# Patient Record
Sex: Male | Born: 1955
Health system: Southern US, Community
[De-identification: ages and names within clinical notes are randomized; demographics above are authoritative.]

## PROBLEM LIST (undated history)

## (undated) DIAGNOSIS — I82409 Acute embolism and thrombosis of unspecified deep veins of unspecified lower extremity: Secondary | ICD-10-CM

## (undated) DIAGNOSIS — E7800 Pure hypercholesterolemia, unspecified: Secondary | ICD-10-CM

## (undated) DIAGNOSIS — M1990 Unspecified osteoarthritis, unspecified site: Secondary | ICD-10-CM

## (undated) DIAGNOSIS — E119 Type 2 diabetes mellitus without complications: Secondary | ICD-10-CM

## (undated) DIAGNOSIS — F419 Anxiety disorder, unspecified: Secondary | ICD-10-CM

## (undated) DIAGNOSIS — IMO0001 Reserved for inherently not codable concepts without codable children: Secondary | ICD-10-CM

## (undated) DIAGNOSIS — C801 Malignant (primary) neoplasm, unspecified: Secondary | ICD-10-CM

## (undated) DIAGNOSIS — I10 Essential (primary) hypertension: Secondary | ICD-10-CM

## (undated) DIAGNOSIS — G4730 Sleep apnea, unspecified: Secondary | ICD-10-CM

## (undated) DIAGNOSIS — K219 Gastro-esophageal reflux disease without esophagitis: Secondary | ICD-10-CM

## (undated) HISTORY — DX: Anxiety disorder, unspecified: F41.9

## (undated) HISTORY — DX: Unspecified osteoarthritis, unspecified site: M19.90

## (undated) HISTORY — PX: HEEL SPUR SURGERY: SHX665

## (undated) HISTORY — PX: BUNIONECTOMY: SHX129

## (undated) HISTORY — DX: Sleep apnea, unspecified: G47.30

## (undated) HISTORY — PX: SHOULDER SURGERY: SHX246

## (undated) HISTORY — PX: TONSILLECTOMY: SUR1361

## (undated) HISTORY — PX: LIPOMA EXCISION: SHX5283

## (undated) HISTORY — PX: ANKLE SURGERY: SHX546

---

## 2000-12-08 ENCOUNTER — Ambulatory Visit (HOSPITAL_COMMUNITY): Admission: RE | Admit: 2000-12-08 | Discharge: 2000-12-08 | Payer: Self-pay | Admitting: Pulmonary Disease

## 2000-12-17 ENCOUNTER — Ambulatory Visit (HOSPITAL_COMMUNITY): Admission: RE | Admit: 2000-12-17 | Discharge: 2000-12-17 | Payer: Self-pay | Admitting: Internal Medicine

## 2000-12-17 ENCOUNTER — Encounter: Payer: Self-pay | Admitting: Internal Medicine

## 2001-03-10 ENCOUNTER — Ambulatory Visit (HOSPITAL_COMMUNITY): Admission: RE | Admit: 2001-03-10 | Discharge: 2001-03-10 | Payer: Self-pay | Admitting: Internal Medicine

## 2001-03-10 ENCOUNTER — Encounter: Payer: Self-pay | Admitting: Internal Medicine

## 2001-04-18 ENCOUNTER — Ambulatory Visit (HOSPITAL_COMMUNITY): Admission: RE | Admit: 2001-04-18 | Discharge: 2001-04-18 | Payer: Self-pay | Admitting: Internal Medicine

## 2002-03-08 ENCOUNTER — Encounter: Admission: RE | Admit: 2002-03-08 | Discharge: 2002-06-06 | Payer: Self-pay | Admitting: Internal Medicine

## 2003-02-19 ENCOUNTER — Encounter: Payer: Self-pay | Admitting: Emergency Medicine

## 2003-02-19 ENCOUNTER — Observation Stay (HOSPITAL_COMMUNITY): Admission: EM | Admit: 2003-02-19 | Discharge: 2003-02-20 | Payer: Self-pay | Admitting: Emergency Medicine

## 2003-02-23 ENCOUNTER — Encounter: Payer: Self-pay | Admitting: *Deleted

## 2003-02-23 ENCOUNTER — Encounter (HOSPITAL_COMMUNITY): Admission: RE | Admit: 2003-02-23 | Discharge: 2003-03-25 | Payer: Self-pay | Admitting: *Deleted

## 2005-03-12 ENCOUNTER — Ambulatory Visit (HOSPITAL_COMMUNITY): Admission: RE | Admit: 2005-03-12 | Discharge: 2005-03-12 | Payer: Self-pay | Admitting: Internal Medicine

## 2005-04-02 ENCOUNTER — Ambulatory Visit: Payer: Self-pay | Admitting: Internal Medicine

## 2005-04-29 ENCOUNTER — Ambulatory Visit: Payer: Self-pay | Admitting: Internal Medicine

## 2005-04-29 ENCOUNTER — Ambulatory Visit (HOSPITAL_COMMUNITY): Admission: RE | Admit: 2005-04-29 | Discharge: 2005-04-29 | Payer: Self-pay | Admitting: Internal Medicine

## 2005-05-01 ENCOUNTER — Inpatient Hospital Stay (HOSPITAL_COMMUNITY): Admission: AD | Admit: 2005-05-01 | Discharge: 2005-05-04 | Payer: Self-pay | Admitting: Internal Medicine

## 2005-05-01 ENCOUNTER — Ambulatory Visit (HOSPITAL_COMMUNITY): Admission: RE | Admit: 2005-05-01 | Discharge: 2005-05-01 | Payer: Self-pay | Admitting: Internal Medicine

## 2005-05-05 ENCOUNTER — Ambulatory Visit (HOSPITAL_COMMUNITY): Payer: Self-pay | Admitting: Internal Medicine

## 2005-05-05 ENCOUNTER — Encounter (HOSPITAL_COMMUNITY): Admission: RE | Admit: 2005-05-05 | Discharge: 2005-06-04 | Payer: Self-pay | Admitting: Oncology

## 2005-05-05 ENCOUNTER — Encounter: Admission: RE | Admit: 2005-05-05 | Discharge: 2005-05-05 | Payer: Self-pay | Admitting: Oncology

## 2005-08-21 ENCOUNTER — Ambulatory Visit (HOSPITAL_COMMUNITY): Admission: RE | Admit: 2005-08-21 | Discharge: 2005-08-21 | Payer: Self-pay | Admitting: Internal Medicine

## 2007-03-04 ENCOUNTER — Ambulatory Visit (HOSPITAL_COMMUNITY): Admission: RE | Admit: 2007-03-04 | Discharge: 2007-03-04 | Payer: Self-pay | Admitting: Internal Medicine

## 2007-09-07 ENCOUNTER — Ambulatory Visit (HOSPITAL_COMMUNITY): Admission: RE | Admit: 2007-09-07 | Discharge: 2007-09-07 | Payer: Self-pay | Admitting: Internal Medicine

## 2008-01-16 ENCOUNTER — Ambulatory Visit (HOSPITAL_COMMUNITY): Admission: RE | Admit: 2008-01-16 | Discharge: 2008-01-16 | Payer: Self-pay | Admitting: Internal Medicine

## 2008-02-22 ENCOUNTER — Ambulatory Visit (HOSPITAL_COMMUNITY): Admission: RE | Admit: 2008-02-22 | Discharge: 2008-02-22 | Payer: Self-pay | Admitting: Internal Medicine

## 2008-04-27 ENCOUNTER — Ambulatory Visit (HOSPITAL_COMMUNITY): Admission: RE | Admit: 2008-04-27 | Discharge: 2008-04-27 | Payer: Self-pay | Admitting: Internal Medicine

## 2008-08-24 ENCOUNTER — Ambulatory Visit (HOSPITAL_COMMUNITY): Admission: RE | Admit: 2008-08-24 | Discharge: 2008-08-24 | Payer: Self-pay | Admitting: Internal Medicine

## 2010-11-28 NOTE — H&P (Signed)
NAMEVIOLA, [name]                   ACCOUNT NO.:  [id number]   MEDICAL RECORD NO.:  [id number]          PATIENT TYPE:  INP   LOCATION:  A331                          FACILITY:  [facility]   PHYSICIAN:  Kingsley Callander. Ouida Sills, MD       DATE OF BIRTH:  20-May-1956   DATE OF ADMISSION:  05/01/2005  DATE OF DISCHARGE:  LH                                HISTORY & PHYSICAL   CHIEF COMPLAINT:  Right leg pain.   HISTORY OF PRESENT ILLNESS:  This patient is a 55 year old white male who  presented to my office complaining of excruciating intermittent pain in the  right lower lateral leg for two days. He states this began after he had a  colonoscopy two days ago. He was found to have some left lateral leg  tenderness. Right calf appeared clearly larger than the left. The right  thigh was not obviously larger than the left. He was sent for an ultrasound  which revealed an acute DVT involving the right popliteal and posterior  tibial veins. The patient has no prior history of clots. I had discussed his  leg pain with Dr. Jena Gauss yesterday. It turns out that Mr. Darco had complained  of crampy pain in the leg upon lying down on the procedure table prior to  the colonoscopy. He felt he needed to let the cramp resolve. After waiting a  few minutes, his symptoms resolved, and he was able to proceed with his  colonoscopy without any problems. Mr. Rosenow states that since then he has had  four episodes of severe pain, each lasting about 30 minutes. He states he  was forced to lie down on his driveway for about 30 minutes during one  episode yesterday afternoon. He has had no recent periods of immobility. He  has no history of underlying malignancy.   PAST MEDICAL HISTORY:  1.  Diabetes.  2.  Hypertension.  3.  Hyperlipidemia.  4.  GERD.  5.  Recent hematuria workup which was negative. His PSA was normal. A CT      scan of the abdomen and pelvis revealed a large right lobe of liver      hemangioma, fatty liver changes, but  no stones. Sigmoid diverticulosis      was present.  6.  Hiatal hernia.  7.  Tonsillectomy.  8.  Bunionectomies.   MEDICATIONS:  1.  Prilosec 20 mg b.i.d.  2.  Zantac 300 mg b.i.d.  3.  TriCor 145 mg daily.  4.  Lisinopril 40 mg daily.  5.  Hydrochlorothiazide 25 mg daily.   ALLERGIES:  VICODIN.   SOCIAL HISTORY:  He does not smoke or drink.   FAMILY HISTORY:  Negative for thromboembolic phenomena.   REVIEW OF SYSTEMS:  No chest pain, shortness of breath, abdominal pain, or  recent bleeding.   PHYSICAL EXAMINATION:  GENERAL:  Alert, obviously uncomfortable appearing,  overweight white male.  HEENT:  Eyes and oropharynx are unremarkable.  NECK:  No JVD, thyromegaly, or lymphadenopathy.  LUNGS:  Clear.  HEART:  Regular with no murmurs.  ABDOMEN:  Nontender. No hepatosplenomegaly.  EXTREMITIES:  The right lateral calf is tender. The right calf is swollen.  The right thigh is nontender. Has a normal right femoral pulse. Pedal pulses  are intact.  NEUROLOGICAL:  Normal.  LYMPH NODES:  No enlargement.  SKIN:  Normal.   IMPRESSION:  1.  Right leg deep vein thrombosis. Due to his severity of pain, he will be      hospitalized initially and treated with Lovenox. Coumadin will be      initiated. A hypercoagulation profile will be obtained.  2.  Diabetes.  3.  Hypertension.  4.  Gastroesophageal reflux disease.  5.  Hyperlipidemia.      Kingsley Callander. Ouida Sills, MD  Electronically Signed     ROF/MEDQ  D:  05/01/2005  T:  05/01/2005  Job:  [id number]

## 2010-11-28 NOTE — Procedures (Signed)
   [name], [name]                               ACCOUNT NO.:  [id number]   MEDICAL RECORD NO.:  [id number]                   PATIENT TYPE:  INP   LOCATION:  IC09                                 FACILITY:  [facility]   PHYSICIAN:  Vida Roller, M.D.                DATE OF BIRTH:  September 26, 1955   DATE OF PROCEDURE:  02/20/2003  DATE OF DISCHARGE:                                  ECHOCARDIOGRAM   REFERRING PHYSICIAN:  Kingsley Callander. Ouida Sills, M.D.   TAPE #:  [id number].   TAPE COUNT:  [id number].   REASON FOR CONSULTATION:  This is a 55 year old gentleman with chest pain  and supraventricular tachycardia in the ICU.  The technical quality of the  study is adequate.   M-MODE MEASUREMENTS:  The aorta is 31 mm.   The left atrium is 45 mm.   The septum is 12 mm.   The posterior wall is 10 mm.   Left ventricular diastolic dimension is 48 mm.   The ventricular systolic dimension is 34 mm.   2-D AND DOPPLER IMAGING:  The left ventricle is normal size with normal  systolic function.  There are no wall motion abnormalities seen.  The  ejection fraction is estimated at 55-60%.  There is no left ventricular  hypertrophy.  Diastolic function appears to be normal.   The right ventricle is normal size with normal systolic function.  No wall  motion abnormalities are seen.   Both atria appear to be just the top end of normal.  The left atrium  actually might be slightly enlarged.  Thee is no evidence of atrial septal  defect.   The aortic valve is slightly sclerotic with no evidence stenosis or  regurgitation.   The mitral valve is morphologically unremarkable with no stenosis.  There is  trace insufficiency.   The tricuspid valve is morphologically unremarkable with trace  insufficiency.  No stenosis is seen.   The pulmonic valve appears to be morphologically unremarkable with trace  insufficiency.  No stenosis is seen.   The inferior vena cava is normal size.   The ascending aorta is normal  size.   There is no evidence pericardial pathology.                                               Vida Roller, M.D.    JH/MEDQ  D:  02/20/2003  T:  02/20/2003  Job:  [id number]

## 2010-11-28 NOTE — Consult Note (Signed)
NAMEAENGUS, SAUCEDA                               ACCOUNT NO.:  [id number]   MEDICAL RECORD NO.:  [id number]                   PATIENT TYPE:  INP   LOCATION:  IC09                                 FACILITY:  [facility]   PHYSICIAN:  Vida Roller, M.D.                DATE OF BIRTH:  August 01, 1955   DATE OF CONSULTATION:  DATE OF DISCHARGE:                                   CONSULTATION   HISTORY OF PRESENT ILLNESS:  Mr. [name] is a 55 year old white male with no  known coronary artery disease who presents with an episode of atypical chest  discomfort associated with palpitations.  He states that he was working  early this morning, had an episode of fleeting, sharp pain underneath his  left nipple which radiated up into his chest associated with reaching up  above his head.  He felt mildly unsteady on his feet and the chest  discomfort was only fleeing initially and then slightly worse after that.  He reported to the fire station, down the road, was evaluated initially with  a blood pressure of 180/80 and then a strip which is available for review  which shows supraventricular tachycardia and he was referred to the  emergency department.  When he presented to the emergency department his  chest discomfort was essentially gone and so was his rapid heart rate.  Electrocardiogram there showed only a normal sinus rhythm.  He was admitted  however, for ongoing chest wall tenderness and for evaluation.   PAST MEDICAL HISTORY:  1. Hypertension which is controlled with medications.  2. Diet-controlled diabetes mellitus with a hemoglobin A1c of 5.9.  3. Gastroesophageal reflux disease.  4. History of a Schatzki ring requiring esophageal dilatation.  5. History of a rapid heart rate evaluated a number of years ago, the     results of which are not available to me.   MEDICATIONS ON PRESENTATION:  1. Prinizide 20 mg a day.  2. Prevacid 30 mg a day.  3. Aspirin 325 mg a day.   SOCIAL HISTORY:  He lives  in [place] with his wife.  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  He does not smoke, does not drink  alcohol, does not use any illicit drugs.   FAMILY HISTORY:  His mother is alive and well at age 52.  His father is  alive and well at age 54.  He has one brother at age 9 who is alive and  well.  He has two children and one grandchild all of whom are healthy.   REVIEW OF SYSTEMS:  Essentially noncontributory other than that mentioned in  the history of present illness.   PHYSICAL EXAMINATION:  GENERAL:  He is a well-developed, well-nourished,  white male in no apparent distress who is alert and oriented x4.  VITAL SIGNS:  His blood pressure is 130/85,  respiratory rate of 12 and his  pulse is 78 and sinus.  He is saturating 99% on room air.  He is afebrile.  HEENT:  Unremarkable.  NECK:  Supple.  There is no jugular venous distention or carotid bruits.  CHEST:  Clear to auscultation.  HEART:  Reveals a regular, rate and rhythm with no murmurs, rubs or gallops.  He does have tenderness to palpation over the area described in the history  of present illness.  ABDOMEN:  Soft, nontender with normoactive bowel sounds.  GU:  Deferred.  RECTAL:  Deferred.  EXTREMITIES:  Shows no clubbing, cyanosis or edema.  Pulses are 2+.  MUSCULOSKELETAL:  Without significant joint deformities.  NEUROLOGIC:  Nonfocal.   Chest x-ray reported to me as no acute disease.   His electrocardiogram shows a sinus rhythm at a rate of 82 with normal  intervals, normal axes, no ST-T wave changes concerning for ischemia and  there is borderline voltage criteria for LVH.   LABORATORY DATA:  His white blood cell count is 7.6, H&H of 14 and 40 with a  platelet count of 217,000.  Sodium 138, potassium 3.8, chloride 104, bicarb  of 29, BUN and creatinine are 19 and 0.9 with a blood sugar of 105.  Liver  function studies were within normal limits.  His D-dimmer is slightly  elevated at 0.58.  His  first set of cardiac enzymes showed a CK of 136 with  a troponin of 0.7.  No PT and INR were drawn.  The patient is on Lovenox.   ASSESSMENT:  This is a gentleman with chest wall pain who has an abnormal D-  dimmer, also a history of supraventricular tachycardia on the strip that  looks like an atrial tachycardia with a heart rate nearly 200 beats a  minute.   RECOMMENDATIONS:  1. Cycle his cardiac enzymes.  2. I agree with the continuation of the aspirin and Lovenox.  3. We will get an echocardiogram to assess his left ventricular size and     function.  4. He will probably need an exercise Cardiolite after his echo.  5. He needs an EP study probably as an outpatient for the supraventricular     tachycardia.  6. Most importantly, he probably needs a spiral CT scan to rule out     pulmonary embolus which is I think, at this point, the leading candidate     for the cause of his chest wall pain with an elevated D-dimmer.                                               Vida Roller, M.D.    JH/MEDQ  D:  02/19/2003  T:  02/20/2003  Job:  [id number]

## 2010-11-28 NOTE — H&P (Signed)
[name], [name]                               ACCOUNT NO.:  [id number]   MEDICAL RECORD NO.:  [id number]                   PATIENT TYPE:  INP   LOCATION:  IC09                                 FACILITY:  [facility]   PHYSICIAN:  Kingsley Callander. Ouida Sills, M.D.                  DATE OF BIRTH:  12-Nov-1955   DATE OF ADMISSION:  02/19/2003  DATE OF DISCHARGE:                                HISTORY & PHYSICAL   CHIEF COMPLAINT:  Chest pain.   HISTORY OF PRESENT ILLNESS:  This patient is a 55 year old white male who  presented to the emergency room after developing heaviness in the left  chest.  He had originally reached up over his head and immediately felt pain  in his chest.  He stopped at a fire department and had a monitor strip  recorded which revealed an SVT in the 180 range.  On presentation to the  emergency room, he was treated with sublingual nitroglycerine which caused  relief of his pain.  He did not experience diaphoresis, nausea, vomiting,  syncope, or any radiation of the symptoms into his arm or neck.  He has a  history of type 2 diabetes and hypertension.  He does not smoke cigarettes.   PAST MEDICAL HISTORY:  1. Gastroesophageal reflux disease.  2. Hypertension.  3. Type 2 diabetes.  4. Tonsillectomy.  5. Hiatal hernia.  6. Bunionectomy.   MEDICATIONS:  1. Prinzide 20/25 q.d.  2. Prevacid 30 mg q.d.  3. Multivitamin q.d.   ALLERGIES:  VICODIN.   SOCIAL HISTORY:  He works for [employer].  He does not smoke cigarettes, drink  alcohol, or use recreational drugs.   FAMILY HISTORY:  Positive for diabetes in his mother.   REVIEW OF SYSTEMS:  His GERD symptoms have been well controlled.  The  present symptoms are definitely different from prior GERD symptoms.  He has  previously had dysphagia with dilation of a Schatzki's ring.   PHYSICAL EXAMINATION:  VITAL SIGNS:  Temperature 98.2.  Pulse 72.  Respirations 16.  Blood pressure 133/85.  His oxygen saturation is 99% on  room air.  GENERAL:  Alert, comfortable appearing, white male.  HEENT:  No scleral icterus.  The pharynx is unremarkable.  NECK:  Supple with no JVD, thyromegaly, or bruit.  LUNGS:  Clear.  HEART:  Regular with no murmurs.  ABDOMEN:  Nontender with no hepatosplenomegaly.  CHEST:  He has tenderness to palpation of the left upper chest.  EXTREMITIES:  Normal pulses.  No cyanosis, clubbing, or edema.  NEUROLOGIC:  Grossly intact.   LABORATORY DATA:  White count 7.6.  Hemoglobin 13.8.  Platelets 217.  His  comp profile is normal.  Initial CPK was 136 with a MB of 2.1 and troponin  of 0.07.  A D-dimer was 0.58.  His EKG revealed normal sinus rhythm, voltage  criteria for LVH,  but no ischemic changes.   IMPRESSION:  1. Chest pain and supraventricular tachycardia.  A rhythm strip from the     fire department revealed what appears to be a supraventricular     tachycardia in the 180 range.  He is being hospitalized in a monitored     setting.  Serial cardiac enzymes, an echocardiogram, and a TSH will be     obtained.  A cardiology consultation with Dr. Dorethea Clan will be obtained.  2. Hypertension, well controlled on Prinzide.  3. Gastroesophageal reflux disease.  Continue Prevacid.                                               Kingsley Callander. Ouida Sills, M.D.    ROF/MEDQ  D:  02/20/2003  T:  02/20/2003  Job:  [id number]

## 2010-11-28 NOTE — Op Note (Signed)
NAME:  [name], HENCE                   ACCOUNT NO.:  [id number]   MEDICAL RECORD NO.:  [id number]          PATIENT TYPE:  AMB   LOCATION:  DAY                           FACILITY:  [facility]   PHYSICIAN:  R. Roetta Sessions, M.D. DATE OF BIRTH:  1955/11/04   DATE OF PROCEDURE:  04/29/2005  DATE OF DISCHARGE:                                 OPERATIVE REPORT   PROCEDURE:  Diagnostic colonoscopy.   INDICATIONS FOR PROCEDURE:  A 55 year old gentleman with recent episode of  left lower quadrant abdominal pain, noncontrast CT for hematuria.  Demonstrated no evidence of kidney stones but a possible hemangioma in his  liver. He responded nicely to a course of Flagyl and Cipro. He is here for  diagnostic colonoscopy. He has done well. He just recently came back from a  cruise in the [place] and has not had any abdominal pain since being seen in  my office on April 02, 2005. This approach has been discussed with the  patient at length. Potential risks, benefits, and alternatives have been  reviewed and questions answered. He is agreeable. Please see documentation  in the medical record.   PROCEDURE NOTE:  O2 saturation, blood pressure, pulse, and respirations were  monitored throughout the entire procedure. Conscious sedation with Versed 5  mg IV and Demerol 100 mg IV in divided doses.   INSTRUMENT:  Olympus video chip system.   FINDINGS:  Digital rectal exam revealed no abnormalities.   ENDOSCOPIC FINDINGS:  Prep was adequate.   Rectum:  Examination of the rectal mucosa including retroflexed view of the  anal verge revealed no abnormalities.   Colon:  Colonic mucosa was surveyed from the rectosigmoid junction through  the left, transverse, and right colon to the area of the appendiceal  orifice, ileocecal valve, and cecum. These structures were well seen and  photographed for the record. From this level, the scope was slowly  withdrawn, and all previously mentioned mucosal surfaces were again  seen.  The patient was noted to have scattered sigmoid diverticula. However, the  colonic mucosa otherwise appeared normal. The patient tolerated the  procedure well and was reactive to endoscopy.   IMPRESSION:  Sigmoid diverticula. The remainder of colonic mucosa appeared  normal. Normal rectum.   RECOMMENDATIONS:  1.  Diverticulosis literature provided to Mr. Laing.  2.  He should bolster his fiber intake, and I have recommended he take a      dose of Citrucel or Metamucil daily  3.  He is to call me if he has any recurrent abdominal pain.      Jonathon Bellows, M.D.  Electronically Signed     RMR/MEDQ  D:  04/29/2005  T:  04/29/2005  Job:  [id number]   cc:   Kingsley Callander. Ouida Sills, MD  Fax: [phone]

## 2010-11-28 NOTE — Consult Note (Signed)
NAME:  [name], [name]                   ACCOUNT NO.:  [id number]   MEDICAL RECORD NO.:  [id number]          PATIENT TYPE:  AMB   LOCATION:  RAD                           FACILITY:  [facility]   PHYSICIAN:  R. Roetta Sessions, M.D. DATE OF BIRTH:  02-15-1956   DATE OF CONSULTATION:  04/02/2005  DATE OF DISCHARGE:                                   CONSULTATION   REASON FOR CONSULTATION:  Recent bout of diverticulitis with potential need  for colonoscopy.   HISTORY OF PRESENT ILLNESS:  Sollie Vultaggio is a 55 year old gentleman who a few  weeks ago noted gross hematuria.  He was seen by Dr. Ouida Sills.  He ultimately  was seen by Dr. Vernie Ammons down in [place].  He ended up with a non-contrast  subsequent contrast CT scan of the abdomen.  There was no evidence of kidney  stones.  He sounds like he had a hemangioma of his liver (that report is not  available for review).  He had a cystoscopy, no evidence of tumor.  Apparently, there was a concern for diverticulitis.  He was treated with a  course of Cipro and Flagyl and his abdominal pain has improved significantly  to the point he barely has left lower quadrant discomfort at this point in  time.  He is back on a regular diet, he is not having any fever or chills.  There has been no melena or rectal bleeding.  Long-standing history of  complicated GERD with a Schatzki's ring requiring dilation previously by me.  He is really doing well and taking ranitidine 300 mg b.i.d. and omeprazole  20 mg p.o. b.i.d. (this is the [facility] regimen which has been prescribed  for him).  This seems to be working well.  His reflux symptoms are well  controlled, and he is not having any dysphagia.  There is no family history  of IBD or _____________.   PAST MEDICAL HISTORY:  1.  Type 2 diabetes mellitus.  2.  Hypertension.  3.  History of GERD.  4.  Epstein-Barr infection back in 2002.   PAST SURGICAL HISTORY:  History of bunion resection.   CURRENT MEDICATIONS:  1.   Multivitamin daily.  2.  Vitamin C daily.  3.  HCTZ/Lisinopril 20/37.5 mg daily.  4.  Tricor 145 mg daily.  5.  Ranitidine as above.  6.  Omeprazole as above.  7.  Aleve p.r.n.   ALLERGIES:  VICODIN.   FAMILY HISTORY:  Negative for chronic GI or liver disease.   SOCIAL HISTORY:  The patient is married and has two children, one step-  child, and one grandchild.  He is employed with [employer].  No tobacco, no alcohol, no illicit drug use.   REVIEW OF SYSTEMS:  No chest pain, dyspnea on exertion.  Weight gain of at  least 10 pounds in the past four years.   PHYSICAL EXAMINATION:  GENERAL:  A large robust gentleman in no acute  distress.  VITAL SIGNS:  Weight 263, height 6 feet 1 inch, temperature 98.2, blood  pressure 140/84, pulse 88.  SKIN:  Warm and dry.  LUNGS:  Clear to auscultation.  CARDIAC:  Regular rate and rhythm without murmurs, rubs, or gallops.  ABDOMEN:  Obese, positive bowel sounds, soft, he has minimal left lower  quadrant tenderness to palpation, no appreciable mass or organomegaly.  EXTREMITIES:  No edema.  RECTAL:  Deferred to time of colonoscopy.   IMPRESSION:  Mr. Loghan Kurtzman is a 55 year old gentleman with what sounds like  clinically a bout of diverticulitis, recently he responded to the  antibiotics.  He is much better now.  I agree with Dr. Ouida Sills, he really  needs to have a colonoscopy.  I would like to let a little more time lapse  before we actually instrument him.   He is going on a cruise to the [place] the first of October and will be back  mid-October.  We will go ahead and plan to set him up for diagnostic  colonoscopy at that time.  In the interim, I will get the CT report for  review.  We will make further recommendations after colonoscopy has been  performed.   I would like to thank Dr. Carylon Perches for allowing me to see this nice  gentleman today.      Jonathon Bellows, M.D.  Electronically Signed      RMR/MEDQ  D:  04/02/2005  T:  04/02/2005  Job:  [id number]   cc:   Veverly Fells. Vernie Ammons, M.D.  Fax: [phone]   Kingsley Callander. Ouida Sills, MD  Fax: [phone]   R. Roetta Sessions, M.D.  [address]  [place] [address]

## 2010-11-28 NOTE — Discharge Summary (Signed)
[name], [name]                   ACCOUNT NO.:  [id number]   MEDICAL RECORD NO.:  [id number]          PATIENT TYPE:  INP   LOCATION:  A331                          FACILITY:  [facility]   PHYSICIAN:  Kingsley Callander. Ouida Sills, MD       DATE OF BIRTH:  [birth year]   DATE OF ADMISSION:  05/01/2005  DATE OF DISCHARGE:  10/23/2006LH                                 DISCHARGE SUMMARY   DISCHARGE DIAGNOSES:  1.  Right leg deep vein thrombosis.  2.  Type 2 diabetes.  3.  Hypertension.  4.  Hyperlipidemia.  5.  Gastroesophageal reflux disease.   HOSPITAL COURSE:  This patient is a 55 year old male who presented with pain  and swelling in the right lower leg.  His ultrasound was positive for DVT  extending into the popliteal region.  He was treated with Lovenox and  Coumadin.  His pain improved.  He was felt to be stable for discharge with  continued therapy with Lovenox as an outpatient.  His INR today was 1.7.  His coagulation panel remains pending.  He will have additional Lovenox  doses through the specialty clinic.  In addition, INR will be obtained  tomorrow.  He will be kept out of work until I see him back in the office in  one week.   DISCHARGE MEDICATIONS:  1.  Coumadin 7.5 mg daily.  2.  Prilosec 20 mg b.i.d.  3.  Zantac 300 mg b.i.d.  4.  Tricor 145mg  daily.  5.  Lisinopril 40 mg daily.  6.  Hydrochlorothiazide 25 mg daily.      Kingsley Callander. Ouida Sills, MD  Electronically Signed     ROF/MEDQ  D:  05/04/2005  T:  05/04/2005  Job:  [id number]

## 2010-11-28 NOTE — Procedures (Signed)
   [name], [name] NO.:  [id number]   MEDICAL RECORD NO.:  [id number]                   PATIENT TYPE:  PREC   LOCATION:                                       FACILITY:   PHYSICIAN:  Vida Roller, M.D.                DATE OF BIRTH:  Jan 23, 1956   DATE OF PROCEDURE:  02/23/2003  DATE OF DISCHARGE:                                    STRESS TEST   EXERCISE CARDIOLITE   BRIEF HISTORY:  [name] is a 55 year old male who was recently admitted to  [facility] for evaluation of chest pain.  He had a chest CT during  that admission that was negative for pulmonary embolus.  He had a 2-D  echocardiogram that showed an ejection fraction of 55-60%.  He had a  tachyarrhythmia, question SVT versus atrial tachycardia.  He ruled out for  an MI.  He was scheduled for an exercise Cardiolite.   Prior to the study today the patient reported no chest pain.  His EKG showed  sinus rhythm, rate 61 beats per minute, with small inferior Q's, early  repolarization.  His resting blood pressure is 122/78.  His target heart  rate was 148 beats per minute.   The patient exercised for a total of 11 minutes, reaching maximum heart rate  of 161.  He had no chest pain.  He had some mild shortness of breath and  fatigue.  The EKG showed ST segment depression which is mild inferolaterally  at peak exercise.  This resolved quickly in recovery.  The final images are  pending at time of this dictation.     [name], P.A. [facility]                  Vida Roller, M.D.    DR/MEDQ  D:  02/23/2003  T:  02/23/2003  Job:  [id number]   cc:   Kingsley Callander. Ouida Sills, M.D.  [address]  [place]  [place] [address]  Fax: [phone]

## 2011-05-04 ENCOUNTER — Ambulatory Visit (HOSPITAL_COMMUNITY)
Admission: RE | Admit: 2011-05-04 | Discharge: 2011-05-04 | Disposition: A | Discharge status: Home / Self Care | Payer: BC Managed Care – PPO | Source: Ambulatory Visit | Attending: Internal Medicine | Admitting: Internal Medicine

## 2011-05-04 ENCOUNTER — Other Ambulatory Visit (HOSPITAL_COMMUNITY): Payer: Self-pay | Admitting: Internal Medicine

## 2011-05-04 DIAGNOSIS — M549 Dorsalgia, unspecified: Secondary | ICD-10-CM

## 2011-05-04 DIAGNOSIS — M546 Pain in thoracic spine: Secondary | ICD-10-CM | POA: Insufficient documentation

## 2012-11-28 ENCOUNTER — Emergency Department (HOSPITAL_COMMUNITY): Payer: BC Managed Care – PPO

## 2012-11-28 ENCOUNTER — Emergency Department (HOSPITAL_COMMUNITY)
Admission: EM | Admit: 2012-11-28 | Discharge: 2012-11-28 | Disposition: A | Discharge status: Home / Self Care | Payer: BC Managed Care – PPO | Attending: Emergency Medicine | Admitting: Emergency Medicine

## 2012-11-28 ENCOUNTER — Encounter (HOSPITAL_COMMUNITY): Payer: Self-pay | Admitting: *Deleted

## 2012-11-28 DIAGNOSIS — Z79899 Other long term (current) drug therapy: Secondary | ICD-10-CM | POA: Insufficient documentation

## 2012-11-28 DIAGNOSIS — Z7901 Long term (current) use of anticoagulants: Secondary | ICD-10-CM | POA: Insufficient documentation

## 2012-11-28 DIAGNOSIS — I10 Essential (primary) hypertension: Secondary | ICD-10-CM | POA: Insufficient documentation

## 2012-11-28 DIAGNOSIS — S20219A Contusion of unspecified front wall of thorax, initial encounter: Secondary | ICD-10-CM | POA: Insufficient documentation

## 2012-11-28 DIAGNOSIS — E119 Type 2 diabetes mellitus without complications: Secondary | ICD-10-CM | POA: Insufficient documentation

## 2012-11-28 DIAGNOSIS — Z86718 Personal history of other venous thrombosis and embolism: Secondary | ICD-10-CM | POA: Insufficient documentation

## 2012-11-28 DIAGNOSIS — K219 Gastro-esophageal reflux disease without esophagitis: Secondary | ICD-10-CM | POA: Insufficient documentation

## 2012-11-28 DIAGNOSIS — Z794 Long term (current) use of insulin: Secondary | ICD-10-CM | POA: Insufficient documentation

## 2012-11-28 DIAGNOSIS — Y9389 Activity, other specified: Secondary | ICD-10-CM | POA: Insufficient documentation

## 2012-11-28 DIAGNOSIS — E7800 Pure hypercholesterolemia, unspecified: Secondary | ICD-10-CM | POA: Insufficient documentation

## 2012-11-28 DIAGNOSIS — S301XXA Contusion of abdominal wall, initial encounter: Secondary | ICD-10-CM

## 2012-11-28 DIAGNOSIS — Z7982 Long term (current) use of aspirin: Secondary | ICD-10-CM | POA: Insufficient documentation

## 2012-11-28 HISTORY — DX: Acute embolism and thrombosis of unspecified deep veins of unspecified lower extremity: I82.409

## 2012-11-28 HISTORY — DX: Gastro-esophageal reflux disease without esophagitis: K21.9

## 2012-11-28 HISTORY — DX: Type 2 diabetes mellitus without complications: E11.9

## 2012-11-28 HISTORY — DX: Reserved for inherently not codable concepts without codable children: IMO0001

## 2012-11-28 HISTORY — DX: Essential (primary) hypertension: I10

## 2012-11-28 HISTORY — DX: Pure hypercholesterolemia, unspecified: E78.00

## 2012-11-28 LAB — BASIC METABOLIC PANEL
CO2: 27 mEq/L (ref 19–32)
Chloride: 102 mEq/L (ref 96–112)
Glucose, Bld: 191 mg/dL — ABNORMAL HIGH (ref 70–99)
Potassium: 4.2 mEq/L (ref 3.5–5.1)
Sodium: 139 mEq/L (ref 135–145)

## 2012-11-28 LAB — CBC WITH DIFFERENTIAL/PLATELET
Basophils Relative: 0 % (ref 0–1)
Eosinophils Absolute: 0.3 10*3/uL (ref 0.0–0.7)
Eosinophils Relative: 3 % (ref 0–5)
HCT: 43.3 % (ref 39.0–52.0)
Hemoglobin: 14.5 g/dL (ref 13.0–17.0)
Lymphocytes Relative: 31 % (ref 12–46)
MCHC: 33.5 g/dL (ref 30.0–36.0)
Neutro Abs: 5.4 10*3/uL (ref 1.7–7.7)
Neutrophils Relative %: 58 % (ref 43–77)
RBC: 4.93 MIL/uL (ref 4.22–5.81)

## 2012-11-28 LAB — URINALYSIS, ROUTINE W REFLEX MICROSCOPIC
Bilirubin Urine: NEGATIVE
Leukocytes, UA: NEGATIVE
Nitrite: NEGATIVE
Specific Gravity, Urine: 1.025 (ref 1.005–1.030)
Urobilinogen, UA: 0.2 mg/dL (ref 0.0–1.0)
pH: 6.5 (ref 5.0–8.0)

## 2012-11-28 LAB — PROTIME-INR
INR: 3.23 — ABNORMAL HIGH (ref 0.00–1.49)
Prothrombin Time: 31.2 seconds — ABNORMAL HIGH (ref 11.6–15.2)

## 2012-11-28 MED ORDER — TRAMADOL HCL 50 MG PO TABS: 50.0000 mg | ORAL_TABLET | Freq: Four times a day (QID) | ORAL | Status: DC | PRN

## 2012-11-28 MED ORDER — IOHEXOL 300 MG/ML  SOLN
100.0000 mL | Freq: Once | INTRAMUSCULAR | Status: AC | PRN
  Administered 2012-11-28: 100 mL via INTRAVENOUS

## 2012-11-28 NOTE — ED Notes (Signed)
Pt alert & oriented x4, stable gait. Patient given discharge instructions, paperwork & prescription(s). Patient  instructed to stop at the registration desk to finish any additional paperwork. Patient verbalized understanding. Pt left department w/ no further questions. 
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
## 2012-11-28 NOTE — ED Notes (Signed)
Pt taken to lab for workers comp drug/ETOH screen by pt advocate.
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
## 2012-11-30 ENCOUNTER — Emergency Department (HOSPITAL_COMMUNITY)
Admission: EM | Admit: 2012-11-30 | Discharge: 2012-11-30 | Disposition: A | Discharge status: Home / Self Care | Payer: BC Managed Care – PPO | Attending: Emergency Medicine | Admitting: Emergency Medicine

## 2012-11-30 ENCOUNTER — Emergency Department (HOSPITAL_COMMUNITY): Payer: BC Managed Care – PPO

## 2012-11-30 ENCOUNTER — Encounter (HOSPITAL_COMMUNITY): Payer: Self-pay | Admitting: *Deleted

## 2012-11-30 DIAGNOSIS — Z794 Long term (current) use of insulin: Secondary | ICD-10-CM | POA: Insufficient documentation

## 2012-11-30 DIAGNOSIS — E119 Type 2 diabetes mellitus without complications: Secondary | ICD-10-CM | POA: Insufficient documentation

## 2012-11-30 DIAGNOSIS — Z87828 Personal history of other (healed) physical injury and trauma: Secondary | ICD-10-CM | POA: Insufficient documentation

## 2012-11-30 DIAGNOSIS — K219 Gastro-esophageal reflux disease without esophagitis: Secondary | ICD-10-CM | POA: Insufficient documentation

## 2012-11-30 DIAGNOSIS — Z7901 Long term (current) use of anticoagulants: Secondary | ICD-10-CM | POA: Insufficient documentation

## 2012-11-30 DIAGNOSIS — R1032 Left lower quadrant pain: Secondary | ICD-10-CM | POA: Insufficient documentation

## 2012-11-30 DIAGNOSIS — Z86718 Personal history of other venous thrombosis and embolism: Secondary | ICD-10-CM | POA: Insufficient documentation

## 2012-11-30 DIAGNOSIS — I10 Essential (primary) hypertension: Secondary | ICD-10-CM | POA: Insufficient documentation

## 2012-11-30 DIAGNOSIS — R109 Unspecified abdominal pain: Secondary | ICD-10-CM

## 2012-11-30 DIAGNOSIS — E7800 Pure hypercholesterolemia, unspecified: Secondary | ICD-10-CM | POA: Insufficient documentation

## 2012-11-30 DIAGNOSIS — Z79899 Other long term (current) drug therapy: Secondary | ICD-10-CM | POA: Insufficient documentation

## 2012-11-30 MED ORDER — OXYCODONE-ACETAMINOPHEN 5-325 MG PO TABS
1.0000 | ORAL_TABLET | Freq: Once | ORAL | Status: AC
  Administered 2012-11-30: 1 via ORAL
  Filled 2012-11-30: qty 1
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
## 2012-11-30 NOTE — ED Notes (Signed)
Patient was seen here 2 days ago for the same complaint and was dx was abdominal wall contusion. Now states pain is worse
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
## 2014-08-24 ENCOUNTER — Inpatient Hospital Stay (HOSPITAL_COMMUNITY): Admit: 2014-08-24 | Payer: Self-pay

## 2014-08-24 ENCOUNTER — Other Ambulatory Visit (HOSPITAL_COMMUNITY)
Admission: RE | Admit: 2014-08-24 | Discharge: 2014-08-24 | Disposition: A | Discharge status: Home / Self Care | Payer: BLUE CROSS/BLUE SHIELD | Source: Other Acute Inpatient Hospital | Attending: Internal Medicine | Admitting: Internal Medicine

## 2014-08-24 DIAGNOSIS — Z7901 Long term (current) use of anticoagulants: Secondary | ICD-10-CM | POA: Insufficient documentation

## 2014-08-24 LAB — PROTIME-INR
INR: 1.77 — ABNORMAL HIGH (ref 0.00–1.49)
PROTHROMBIN TIME: 20.8 s — AB (ref 11.6–15.2)

## 2014-09-03 ENCOUNTER — Other Ambulatory Visit (HOSPITAL_COMMUNITY): Payer: Self-pay | Admitting: Internal Medicine

## 2014-09-03 ENCOUNTER — Ambulatory Visit (HOSPITAL_COMMUNITY)
Admission: RE | Admit: 2014-09-03 | Discharge: 2014-09-03 | Disposition: A | Discharge status: Home / Self Care | Payer: BLUE CROSS/BLUE SHIELD | Source: Ambulatory Visit | Attending: Internal Medicine | Admitting: Internal Medicine

## 2014-09-03 DIAGNOSIS — R079 Chest pain, unspecified: Secondary | ICD-10-CM | POA: Insufficient documentation

## 2014-09-03 DIAGNOSIS — R05 Cough: Secondary | ICD-10-CM | POA: Insufficient documentation

## 2014-09-03 DIAGNOSIS — R059 Cough, unspecified: Secondary | ICD-10-CM

## 2014-09-14 ENCOUNTER — Encounter (HOSPITAL_COMMUNITY)
Admission: RE | Admit: 2014-09-14 | Discharge: 2014-09-14 | Disposition: A | Discharge status: Home / Self Care | Payer: BLUE CROSS/BLUE SHIELD | Source: Ambulatory Visit | Attending: Internal Medicine | Admitting: Internal Medicine

## 2015-02-04 ENCOUNTER — Other Ambulatory Visit (HOSPITAL_COMMUNITY): Payer: Self-pay | Admitting: Internal Medicine

## 2015-02-04 DIAGNOSIS — I82403 Acute embolism and thrombosis of unspecified deep veins of lower extremity, bilateral: Secondary | ICD-10-CM

## 2015-02-07 ENCOUNTER — Ambulatory Visit (HOSPITAL_COMMUNITY)
Admission: RE | Admit: 2015-02-07 | Discharge: 2015-02-07 | Disposition: A | Discharge status: Home / Self Care | Payer: BLUE CROSS/BLUE SHIELD | Source: Ambulatory Visit | Attending: Internal Medicine | Admitting: Internal Medicine

## 2015-02-07 DIAGNOSIS — R938 Abnormal findings on diagnostic imaging of other specified body structures: Secondary | ICD-10-CM | POA: Insufficient documentation

## 2015-02-07 DIAGNOSIS — I82403 Acute embolism and thrombosis of unspecified deep veins of lower extremity, bilateral: Secondary | ICD-10-CM | POA: Insufficient documentation

## 2015-03-12 ENCOUNTER — Telehealth (HOSPITAL_COMMUNITY): Payer: Self-pay | Admitting: Physical Therapy

## 2015-03-12 ENCOUNTER — Ambulatory Visit (HOSPITAL_COMMUNITY): Payer: BLUE CROSS/BLUE SHIELD | Attending: Orthopedic Surgery | Admitting: Physical Therapy

## 2015-03-12 NOTE — Telephone Encounter (Signed)
Patient arrived without referral in hand. Per front desk, who called MD office, they had given the only copy of referral to the patient; they will be able to send another one but since MD is in surgery would not be soon today. Patient agreeable to being rescheduled for Thursday with same therapist and was advised by front desk staff that we will need referral to begin eval/treatments.   Deniece Ree PT, DPT [phone]

## 2015-03-14 ENCOUNTER — Ambulatory Visit (HOSPITAL_COMMUNITY): Payer: BLUE CROSS/BLUE SHIELD | Attending: Orthopedic Surgery | Admitting: Physical Therapy

## 2015-03-14 NOTE — Therapy (Unsigned)
[place], [place], [address] Phone: [phone]   Fax:  [phone]  Patient Details  Name: [name] MRN: [id number] Date of Birth: 15-Nov-1955 Referring Provider:  Wylene Simmer, MD  Encounter Date: 03/14/2015  PT clinic staff noted that patient's referral in EPIC was not valid as it did not have official diagnosis; front desk staff faxed request for new referral and also physically called MD office to see if PA could fill it out and send today. PT spoke to   Hunt Oris 03/14/2015, 12:00 PM  [place], [place], [address] Phone: [phone]   Fax:  [phone]

## 2015-07-12 ENCOUNTER — Ambulatory Visit (HOSPITAL_COMMUNITY)
Admission: RE | Admit: 2015-07-12 | Discharge: 2015-07-12 | Disposition: A | Discharge status: Home / Self Care | Payer: BLUE CROSS/BLUE SHIELD | Source: Ambulatory Visit | Attending: Orthopaedic Surgery | Admitting: Orthopaedic Surgery

## 2015-07-12 ENCOUNTER — Other Ambulatory Visit (HOSPITAL_COMMUNITY): Payer: Self-pay | Admitting: Orthopaedic Surgery

## 2015-07-12 DIAGNOSIS — M79672 Pain in left foot: Secondary | ICD-10-CM | POA: Diagnosis present

## 2015-07-12 DIAGNOSIS — M7662 Achilles tendinitis, left leg: Secondary | ICD-10-CM | POA: Diagnosis not present

## 2015-07-12 DIAGNOSIS — M9262 Juvenile osteochondrosis of tarsus, left ankle: Secondary | ICD-10-CM | POA: Insufficient documentation

## 2015-07-12 DIAGNOSIS — R52 Pain, unspecified: Secondary | ICD-10-CM

## 2015-07-16 ENCOUNTER — Other Ambulatory Visit (HOSPITAL_COMMUNITY): Payer: Self-pay

## 2016-07-17 DIAGNOSIS — E119 Type 2 diabetes mellitus without complications: Secondary | ICD-10-CM | POA: Diagnosis not present

## 2016-07-17 DIAGNOSIS — I10 Essential (primary) hypertension: Secondary | ICD-10-CM | POA: Diagnosis not present

## 2016-08-13 DIAGNOSIS — E782 Mixed hyperlipidemia: Secondary | ICD-10-CM | POA: Diagnosis not present

## 2016-08-13 DIAGNOSIS — I10 Essential (primary) hypertension: Secondary | ICD-10-CM | POA: Diagnosis not present

## 2016-08-13 DIAGNOSIS — E119 Type 2 diabetes mellitus without complications: Secondary | ICD-10-CM | POA: Diagnosis not present

## 2016-09-04 DIAGNOSIS — M5416 Radiculopathy, lumbar region: Secondary | ICD-10-CM | POA: Diagnosis not present

## 2016-09-09 DIAGNOSIS — H9313 Tinnitus, bilateral: Secondary | ICD-10-CM | POA: Diagnosis not present

## 2016-09-09 DIAGNOSIS — H903 Sensorineural hearing loss, bilateral: Secondary | ICD-10-CM | POA: Diagnosis not present

## 2016-10-13 DIAGNOSIS — I10 Essential (primary) hypertension: Secondary | ICD-10-CM | POA: Diagnosis not present

## 2016-10-13 DIAGNOSIS — Z79899 Other long term (current) drug therapy: Secondary | ICD-10-CM | POA: Diagnosis not present

## 2016-10-13 DIAGNOSIS — E119 Type 2 diabetes mellitus without complications: Secondary | ICD-10-CM | POA: Diagnosis not present

## 2016-10-16 DIAGNOSIS — E785 Hyperlipidemia, unspecified: Secondary | ICD-10-CM | POA: Diagnosis not present

## 2016-10-16 DIAGNOSIS — I10 Essential (primary) hypertension: Secondary | ICD-10-CM | POA: Diagnosis not present

## 2016-10-16 DIAGNOSIS — E119 Type 2 diabetes mellitus without complications: Secondary | ICD-10-CM | POA: Diagnosis not present

## 2016-12-01 DIAGNOSIS — Z0389 Encounter for observation for other suspected diseases and conditions ruled out: Secondary | ICD-10-CM | POA: Diagnosis not present

## 2016-12-01 DIAGNOSIS — M79672 Pain in left foot: Secondary | ICD-10-CM | POA: Diagnosis not present

## 2016-12-01 DIAGNOSIS — M79671 Pain in right foot: Secondary | ICD-10-CM | POA: Diagnosis not present

## 2016-12-01 DIAGNOSIS — R009 Unspecified abnormalities of heart beat: Secondary | ICD-10-CM | POA: Diagnosis not present

## 2016-12-01 DIAGNOSIS — G4733 Obstructive sleep apnea (adult) (pediatric): Secondary | ICD-10-CM | POA: Diagnosis not present

## 2016-12-08 DIAGNOSIS — G4733 Obstructive sleep apnea (adult) (pediatric): Secondary | ICD-10-CM | POA: Diagnosis not present

## 2016-12-17 DIAGNOSIS — T63441A Toxic effect of venom of bees, accidental (unintentional), initial encounter: Secondary | ICD-10-CM | POA: Diagnosis not present

## 2017-01-08 DIAGNOSIS — H1045 Other chronic allergic conjunctivitis: Secondary | ICD-10-CM | POA: Diagnosis not present

## 2017-01-08 DIAGNOSIS — H524 Presbyopia: Secondary | ICD-10-CM | POA: Diagnosis not present

## 2017-01-08 DIAGNOSIS — E119 Type 2 diabetes mellitus without complications: Secondary | ICD-10-CM | POA: Diagnosis not present

## 2017-01-25 DIAGNOSIS — E119 Type 2 diabetes mellitus without complications: Secondary | ICD-10-CM | POA: Diagnosis not present

## 2017-01-28 DIAGNOSIS — S91302A Unspecified open wound, left foot, initial encounter: Secondary | ICD-10-CM | POA: Diagnosis not present

## 2017-01-28 DIAGNOSIS — E119 Type 2 diabetes mellitus without complications: Secondary | ICD-10-CM | POA: Diagnosis not present

## 2017-01-29 DIAGNOSIS — Z9889 Other specified postprocedural states: Secondary | ICD-10-CM | POA: Diagnosis not present

## 2017-01-29 DIAGNOSIS — E119 Type 2 diabetes mellitus without complications: Secondary | ICD-10-CM | POA: Diagnosis not present

## 2017-01-29 DIAGNOSIS — L89609 Pressure ulcer of unspecified heel, unspecified stage: Secondary | ICD-10-CM | POA: Diagnosis not present

## 2017-02-12 DIAGNOSIS — T783XXA Angioneurotic edema, initial encounter: Secondary | ICD-10-CM | POA: Diagnosis not present

## 2017-02-12 DIAGNOSIS — Z794 Long term (current) use of insulin: Secondary | ICD-10-CM | POA: Diagnosis not present

## 2017-02-12 DIAGNOSIS — Z79899 Other long term (current) drug therapy: Secondary | ICD-10-CM | POA: Diagnosis not present

## 2017-02-12 DIAGNOSIS — R0602 Shortness of breath: Secondary | ICD-10-CM | POA: Diagnosis not present

## 2017-02-15 DIAGNOSIS — G4733 Obstructive sleep apnea (adult) (pediatric): Secondary | ICD-10-CM | POA: Diagnosis not present

## 2017-02-19 DIAGNOSIS — E11621 Type 2 diabetes mellitus with foot ulcer: Secondary | ICD-10-CM | POA: Diagnosis not present

## 2017-02-19 DIAGNOSIS — E785 Hyperlipidemia, unspecified: Secondary | ICD-10-CM | POA: Diagnosis not present

## 2017-02-19 DIAGNOSIS — K219 Gastro-esophageal reflux disease without esophagitis: Secondary | ICD-10-CM | POA: Diagnosis not present

## 2017-03-23 DIAGNOSIS — T781XXA Other adverse food reactions, not elsewhere classified, initial encounter: Secondary | ICD-10-CM | POA: Diagnosis not present

## 2017-03-23 DIAGNOSIS — R062 Wheezing: Secondary | ICD-10-CM | POA: Diagnosis not present

## 2017-03-23 DIAGNOSIS — H1045 Other chronic allergic conjunctivitis: Secondary | ICD-10-CM | POA: Diagnosis not present

## 2017-03-23 DIAGNOSIS — J309 Allergic rhinitis, unspecified: Secondary | ICD-10-CM | POA: Diagnosis not present

## 2017-05-04 DIAGNOSIS — E119 Type 2 diabetes mellitus without complications: Secondary | ICD-10-CM | POA: Diagnosis not present

## 2017-05-07 DIAGNOSIS — E119 Type 2 diabetes mellitus without complications: Secondary | ICD-10-CM | POA: Diagnosis not present

## 2017-05-07 DIAGNOSIS — S91302D Unspecified open wound, left foot, subsequent encounter: Secondary | ICD-10-CM | POA: Diagnosis not present

## 2017-05-12 ENCOUNTER — Encounter (HOSPITAL_BASED_OUTPATIENT_CLINIC_OR_DEPARTMENT_OTHER): Payer: 59 | Attending: Surgery

## 2017-05-12 DIAGNOSIS — Z9989 Dependence on other enabling machines and devices: Secondary | ICD-10-CM | POA: Insufficient documentation

## 2017-05-12 DIAGNOSIS — K219 Gastro-esophageal reflux disease without esophagitis: Secondary | ICD-10-CM | POA: Diagnosis not present

## 2017-05-12 DIAGNOSIS — G4730 Sleep apnea, unspecified: Secondary | ICD-10-CM | POA: Insufficient documentation

## 2017-05-12 DIAGNOSIS — I10 Essential (primary) hypertension: Secondary | ICD-10-CM | POA: Insufficient documentation

## 2017-05-12 DIAGNOSIS — E1165 Type 2 diabetes mellitus with hyperglycemia: Secondary | ICD-10-CM | POA: Diagnosis not present

## 2017-05-12 DIAGNOSIS — E11621 Type 2 diabetes mellitus with foot ulcer: Secondary | ICD-10-CM | POA: Insufficient documentation

## 2017-05-12 DIAGNOSIS — Z794 Long term (current) use of insulin: Secondary | ICD-10-CM | POA: Insufficient documentation

## 2017-05-12 DIAGNOSIS — L97426 Non-pressure chronic ulcer of left heel and midfoot with bone involvement without evidence of necrosis: Secondary | ICD-10-CM | POA: Diagnosis not present

## 2017-05-12 DIAGNOSIS — L97422 Non-pressure chronic ulcer of left heel and midfoot with fat layer exposed: Secondary | ICD-10-CM | POA: Diagnosis not present

## 2017-05-12 DIAGNOSIS — Z79899 Other long term (current) drug therapy: Secondary | ICD-10-CM | POA: Insufficient documentation

## 2017-05-12 DIAGNOSIS — Z7982 Long term (current) use of aspirin: Secondary | ICD-10-CM | POA: Diagnosis not present

## 2017-05-12 DIAGNOSIS — E7800 Pure hypercholesterolemia, unspecified: Secondary | ICD-10-CM | POA: Insufficient documentation

## 2017-05-12 DIAGNOSIS — Z86718 Personal history of other venous thrombosis and embolism: Secondary | ICD-10-CM | POA: Diagnosis not present

## 2017-05-13 ENCOUNTER — Other Ambulatory Visit: Payer: Self-pay | Admitting: Surgery

## 2017-05-13 DIAGNOSIS — E13621 Other specified diabetes mellitus with foot ulcer: Secondary | ICD-10-CM

## 2017-05-13 DIAGNOSIS — L97409 Non-pressure chronic ulcer of unspecified heel and midfoot with unspecified severity: Principal | ICD-10-CM

## 2017-05-13 DIAGNOSIS — M869 Osteomyelitis, unspecified: Secondary | ICD-10-CM

## 2017-05-27 ENCOUNTER — Ambulatory Visit
Admission: RE | Admit: 2017-05-27 | Discharge: 2017-05-27 | Disposition: A | Discharge status: Home / Self Care | Payer: 59 | Source: Ambulatory Visit | Attending: Surgery | Admitting: Surgery

## 2017-05-27 DIAGNOSIS — L97509 Non-pressure chronic ulcer of other part of unspecified foot with unspecified severity: Secondary | ICD-10-CM | POA: Diagnosis not present

## 2017-05-27 DIAGNOSIS — L97409 Non-pressure chronic ulcer of unspecified heel and midfoot with unspecified severity: Principal | ICD-10-CM

## 2017-05-27 DIAGNOSIS — E13621 Other specified diabetes mellitus with foot ulcer: Secondary | ICD-10-CM

## 2017-05-27 DIAGNOSIS — M869 Osteomyelitis, unspecified: Secondary | ICD-10-CM

## 2017-05-27 MED ORDER — GADOBENATE DIMEGLUMINE 529 MG/ML IV SOLN
20.0000 mL | Freq: Once | INTRAVENOUS | Status: AC | PRN
  Administered 2017-05-27: 20 mL via INTRAVENOUS

## 2017-06-01 ENCOUNTER — Encounter (HOSPITAL_BASED_OUTPATIENT_CLINIC_OR_DEPARTMENT_OTHER): Payer: 59 | Attending: Surgery

## 2017-06-01 DIAGNOSIS — Z6837 Body mass index (BMI) 37.0-37.9, adult: Secondary | ICD-10-CM | POA: Insufficient documentation

## 2017-06-01 DIAGNOSIS — L97422 Non-pressure chronic ulcer of left heel and midfoot with fat layer exposed: Secondary | ICD-10-CM | POA: Diagnosis not present

## 2017-06-01 DIAGNOSIS — E11622 Type 2 diabetes mellitus with other skin ulcer: Secondary | ICD-10-CM | POA: Insufficient documentation

## 2017-06-01 DIAGNOSIS — I10 Essential (primary) hypertension: Secondary | ICD-10-CM | POA: Diagnosis not present

## 2017-06-01 DIAGNOSIS — E11621 Type 2 diabetes mellitus with foot ulcer: Secondary | ICD-10-CM | POA: Diagnosis not present

## 2017-06-01 DIAGNOSIS — G4730 Sleep apnea, unspecified: Secondary | ICD-10-CM | POA: Insufficient documentation

## 2017-06-01 DIAGNOSIS — L97822 Non-pressure chronic ulcer of other part of left lower leg with fat layer exposed: Secondary | ICD-10-CM | POA: Insufficient documentation

## 2017-06-08 DIAGNOSIS — E11621 Type 2 diabetes mellitus with foot ulcer: Secondary | ICD-10-CM | POA: Diagnosis not present

## 2017-06-08 DIAGNOSIS — L97422 Non-pressure chronic ulcer of left heel and midfoot with fat layer exposed: Secondary | ICD-10-CM | POA: Diagnosis not present

## 2017-06-08 DIAGNOSIS — E11622 Type 2 diabetes mellitus with other skin ulcer: Secondary | ICD-10-CM | POA: Diagnosis not present

## 2017-06-17 DIAGNOSIS — Z86010 Personal history of colonic polyps: Secondary | ICD-10-CM | POA: Diagnosis not present

## 2017-06-17 DIAGNOSIS — E119 Type 2 diabetes mellitus without complications: Secondary | ICD-10-CM | POA: Diagnosis not present

## 2017-06-17 DIAGNOSIS — K219 Gastro-esophageal reflux disease without esophagitis: Secondary | ICD-10-CM | POA: Diagnosis not present

## 2017-08-05 DIAGNOSIS — E119 Type 2 diabetes mellitus without complications: Secondary | ICD-10-CM | POA: Diagnosis not present

## 2017-08-10 DIAGNOSIS — E119 Type 2 diabetes mellitus without complications: Secondary | ICD-10-CM | POA: Diagnosis not present

## 2017-08-10 DIAGNOSIS — I10 Essential (primary) hypertension: Secondary | ICD-10-CM | POA: Diagnosis not present

## 2017-08-13 DIAGNOSIS — M545 Low back pain: Secondary | ICD-10-CM | POA: Diagnosis not present

## 2017-08-13 DIAGNOSIS — L97429 Non-pressure chronic ulcer of left heel and midfoot with unspecified severity: Secondary | ICD-10-CM | POA: Diagnosis not present

## 2017-08-13 DIAGNOSIS — E11621 Type 2 diabetes mellitus with foot ulcer: Secondary | ICD-10-CM | POA: Diagnosis not present

## 2017-11-23 DIAGNOSIS — G4733 Obstructive sleep apnea (adult) (pediatric): Secondary | ICD-10-CM | POA: Diagnosis not present

## 2017-11-29 DIAGNOSIS — G4733 Obstructive sleep apnea (adult) (pediatric): Secondary | ICD-10-CM | POA: Diagnosis not present

## 2017-12-16 DIAGNOSIS — E119 Type 2 diabetes mellitus without complications: Secondary | ICD-10-CM | POA: Diagnosis not present

## 2017-12-16 DIAGNOSIS — Z79899 Other long term (current) drug therapy: Secondary | ICD-10-CM | POA: Diagnosis not present

## 2017-12-16 DIAGNOSIS — E785 Hyperlipidemia, unspecified: Secondary | ICD-10-CM | POA: Diagnosis not present

## 2017-12-16 DIAGNOSIS — I10 Essential (primary) hypertension: Secondary | ICD-10-CM | POA: Diagnosis not present

## 2017-12-17 DIAGNOSIS — N481 Balanitis: Secondary | ICD-10-CM | POA: Diagnosis not present

## 2017-12-17 DIAGNOSIS — E119 Type 2 diabetes mellitus without complications: Secondary | ICD-10-CM | POA: Diagnosis not present

## 2017-12-17 DIAGNOSIS — E785 Hyperlipidemia, unspecified: Secondary | ICD-10-CM | POA: Diagnosis not present

## 2017-12-23 DIAGNOSIS — G4733 Obstructive sleep apnea (adult) (pediatric): Secondary | ICD-10-CM | POA: Diagnosis not present

## 2017-12-24 DIAGNOSIS — G4733 Obstructive sleep apnea (adult) (pediatric): Secondary | ICD-10-CM | POA: Diagnosis not present

## 2018-01-28 DIAGNOSIS — D3132 Benign neoplasm of left choroid: Secondary | ICD-10-CM | POA: Diagnosis not present

## 2018-01-28 DIAGNOSIS — H524 Presbyopia: Secondary | ICD-10-CM | POA: Diagnosis not present

## 2018-01-28 DIAGNOSIS — E1136 Type 2 diabetes mellitus with diabetic cataract: Secondary | ICD-10-CM | POA: Diagnosis not present

## 2018-02-03 DIAGNOSIS — J300 Vasomotor rhinitis: Secondary | ICD-10-CM | POA: Diagnosis not present

## 2018-02-03 DIAGNOSIS — T781XXD Other adverse food reactions, not elsewhere classified, subsequent encounter: Secondary | ICD-10-CM | POA: Diagnosis not present

## 2018-02-03 DIAGNOSIS — H1045 Other chronic allergic conjunctivitis: Secondary | ICD-10-CM | POA: Diagnosis not present

## 2018-03-25 DIAGNOSIS — E119 Type 2 diabetes mellitus without complications: Secondary | ICD-10-CM | POA: Diagnosis not present

## 2018-03-31 DIAGNOSIS — E1159 Type 2 diabetes mellitus with other circulatory complications: Secondary | ICD-10-CM | POA: Diagnosis not present

## 2018-03-31 DIAGNOSIS — M791 Myalgia, unspecified site: Secondary | ICD-10-CM | POA: Diagnosis not present

## 2018-04-04 DIAGNOSIS — M791 Myalgia, unspecified site: Secondary | ICD-10-CM | POA: Diagnosis not present

## 2018-05-30 ENCOUNTER — Encounter: Payer: 59 | Attending: Internal Medicine | Admitting: Nutrition

## 2018-05-30 ENCOUNTER — Encounter: Payer: Self-pay | Admitting: Nutrition

## 2018-05-30 VITALS — Ht 73.0 in | Wt 304.0 lb

## 2018-05-30 DIAGNOSIS — E118 Type 2 diabetes mellitus with unspecified complications: Secondary | ICD-10-CM

## 2018-05-30 DIAGNOSIS — E119 Type 2 diabetes mellitus without complications: Secondary | ICD-10-CM | POA: Diagnosis not present

## 2018-05-30 DIAGNOSIS — Z713 Dietary counseling and surveillance: Secondary | ICD-10-CM | POA: Insufficient documentation

## 2018-05-30 DIAGNOSIS — IMO0002 Reserved for concepts with insufficient information to code with codable children: Secondary | ICD-10-CM

## 2018-05-30 DIAGNOSIS — E669 Obesity, unspecified: Secondary | ICD-10-CM

## 2018-05-30 DIAGNOSIS — E1165 Type 2 diabetes mellitus with hyperglycemia: Secondary | ICD-10-CM

## 2018-05-30 NOTE — Progress Notes (Addendum)
  Medical Nutrition Therapy:  Appt start time: 1500 end time:  1600.   Assessment:  Primary concerns today: Dm Type 2, Obesity and MMA.  Lives with his wife. He eats 2-3 meals per day. A1C 8.6%. Basaglar 80 units BID,  and Metformin 1000 mg BID and Novolog 14 units TID and Glucotrol.[place]. Chief Financial Officer for [employer] Drinks a lot of diet sodas. Snacks 1-2 times per day. Stays hungry.  Walks a lot on his job. Likes to hunt, fish and trap. Fairly active. Had surgery on his left heel due to plantar fascitis. Checks BS 2-3 times per day. 150-200's Current diet is low in low carb vegetables. No longer eating beef, pork or chicken due to MMA allergy. Still drinking and eating dairy products. Says it doesn't bother him. Likes to drink a lot o milk. Willing to work on making changes with diet to lose weight and improve his blood sugars   Preferred Learning Style:   No preference indicated   Learning Readiness:   Not ready  Contemplating  Ready  Change in progress   MEDICATIONS:    DIETARY INTAKE:  24-hr recall:  B (  630 AM) 2+ cups: Rice Krispies or CHerrrios with 2% milk,  OJ -8-12 oz  Snk ( AM): nabs,  Diet Pepsi L ( PM): Seafood broil with red potatoes,  Corn 1/2 -2   Sf tea Snk ( PM): D ( PM): Fried perch, french fries and slaw,  Dt Pepsi Snk ( PM):  Beverages: Diet soda,   Usual physical activity: Walks during day on job, walks on job.    Estimated energy needs: 1800 calories 200  g carbohydrates 135 g protein 50 g fat  Progress Towards Goal(s):  In progress.   Nutritional Diagnosis:  NB-1.1 Food and nutrition-related knowledge deficit As related to Diabetes Type 2.  As evidenced by A1C 8.6%.    Intervention:  Nutrition and Diabetes education provided on My Plate, CHO counting, meal planning, portion sizes, timing of meals, avoiding snacks between meals unless having a low blood sugar, target ranges for A1C and blood sugars, signs/symptoms and treatment of  hyper/hypoglycemia, monitoring blood sugars, taking medications as prescribed, benefits of exercising 30 minutes per day and prevention of complications of DM. Educated on sliding scale insulin coverage for Novolog. DIet for Alpha Gal, MMA .  Goals 1. Follow MY Plate 2. Cut out diet sodas 3. Drink 4-5 bottles of water 4. Cut out snacks between meals. Get A1C down to 7.5% Lose 5 lbs per month   Teaching Method Utilized:  Visual Auditory Hands on  Handouts given during visit include:  The Plate Method   Meal Plan Card    Barriers to learning/adherence to lifestyle change: none  Demonstrated degree of understanding via:  Teach Back   Monitoring/Evaluation:  Dietary intake, exercise, , and body weight in 3 months. Recommend to stop Glucotrol as risks outweigh the benefits.  He would benefit from seeing a Endocrinologist to help manage his DM and risk factors.

## 2018-05-30 NOTE — Patient Instructions (Addendum)
Goals 1. Follow MY Plate 2. Cut out diet sodas 3. Drink 4-5 bottles of water 4. Cut out snacks between meals. Get A1C down to 7.5% Lose 5 lbs per month

## 2018-06-20 DIAGNOSIS — E1122 Type 2 diabetes mellitus with diabetic chronic kidney disease: Secondary | ICD-10-CM | POA: Diagnosis not present

## 2018-06-22 DIAGNOSIS — R202 Paresthesia of skin: Secondary | ICD-10-CM | POA: Diagnosis not present

## 2018-06-22 DIAGNOSIS — M791 Myalgia, unspecified site: Secondary | ICD-10-CM | POA: Diagnosis not present

## 2018-06-22 DIAGNOSIS — E1159 Type 2 diabetes mellitus with other circulatory complications: Secondary | ICD-10-CM | POA: Diagnosis not present

## 2018-06-24 DIAGNOSIS — G4733 Obstructive sleep apnea (adult) (pediatric): Secondary | ICD-10-CM | POA: Diagnosis not present

## 2018-07-25 DIAGNOSIS — Z139 Encounter for screening, unspecified: Secondary | ICD-10-CM | POA: Diagnosis not present

## 2018-07-25 DIAGNOSIS — M2578 Osteophyte, vertebrae: Secondary | ICD-10-CM | POA: Diagnosis not present

## 2018-07-25 DIAGNOSIS — M5126 Other intervertebral disc displacement, lumbar region: Secondary | ICD-10-CM | POA: Diagnosis not present

## 2018-07-25 DIAGNOSIS — E119 Type 2 diabetes mellitus without complications: Secondary | ICD-10-CM | POA: Diagnosis not present

## 2018-07-25 DIAGNOSIS — M4186 Other forms of scoliosis, lumbar region: Secondary | ICD-10-CM | POA: Diagnosis not present

## 2018-07-25 DIAGNOSIS — M5416 Radiculopathy, lumbar region: Secondary | ICD-10-CM | POA: Diagnosis not present

## 2018-07-27 ENCOUNTER — Ambulatory Visit: Payer: Self-pay | Admitting: Nutrition

## 2018-09-15 DIAGNOSIS — E1159 Type 2 diabetes mellitus with other circulatory complications: Secondary | ICD-10-CM | POA: Diagnosis not present

## 2018-09-15 DIAGNOSIS — I10 Essential (primary) hypertension: Secondary | ICD-10-CM | POA: Diagnosis not present

## 2018-09-15 DIAGNOSIS — Z79899 Other long term (current) drug therapy: Secondary | ICD-10-CM | POA: Diagnosis not present

## 2018-09-20 DIAGNOSIS — E785 Hyperlipidemia, unspecified: Secondary | ICD-10-CM | POA: Diagnosis not present

## 2018-09-20 DIAGNOSIS — E1159 Type 2 diabetes mellitus with other circulatory complications: Secondary | ICD-10-CM | POA: Diagnosis not present

## 2018-09-20 DIAGNOSIS — K219 Gastro-esophageal reflux disease without esophagitis: Secondary | ICD-10-CM | POA: Diagnosis not present

## 2018-09-27 DIAGNOSIS — G4733 Obstructive sleep apnea (adult) (pediatric): Secondary | ICD-10-CM | POA: Diagnosis not present

## 2019-06-13 ENCOUNTER — Other Ambulatory Visit: Payer: Self-pay

## 2019-06-13 DIAGNOSIS — Z20822 Contact with and (suspected) exposure to covid-19: Secondary | ICD-10-CM

## 2019-06-15 LAB — NOVEL CORONAVIRUS, NAA: SARS-CoV-2, NAA: NOT DETECTED

## 2019-07-12 ENCOUNTER — Ambulatory Visit: Payer: Self-pay

## 2019-07-12 ENCOUNTER — Encounter: Payer: Self-pay | Admitting: Orthopaedic Surgery

## 2019-07-12 ENCOUNTER — Other Ambulatory Visit: Payer: Self-pay

## 2019-07-12 ENCOUNTER — Ambulatory Visit (INDEPENDENT_AMBULATORY_CARE_PROVIDER_SITE_OTHER): Payer: 59 | Admitting: Orthopaedic Surgery

## 2019-07-12 VITALS — Ht 73.0 in | Wt 305.0 lb

## 2019-07-12 DIAGNOSIS — G8929 Other chronic pain: Secondary | ICD-10-CM

## 2019-07-12 DIAGNOSIS — E6609 Other obesity due to excess calories: Secondary | ICD-10-CM | POA: Insufficient documentation

## 2019-07-12 DIAGNOSIS — M25551 Pain in right hip: Secondary | ICD-10-CM

## 2019-07-12 DIAGNOSIS — M545 Low back pain, unspecified: Secondary | ICD-10-CM | POA: Insufficient documentation

## 2019-07-12 DIAGNOSIS — M5441 Lumbago with sciatica, right side: Secondary | ICD-10-CM

## 2019-07-12 DIAGNOSIS — Z6832 Body mass index (BMI) 32.0-32.9, adult: Secondary | ICD-10-CM | POA: Insufficient documentation

## 2019-07-12 NOTE — Addendum Note (Signed)
Addended by: Lendon Collar on: 07/12/2019 02:13 PM   Modules accepted: Orders
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
## 2019-08-04 ENCOUNTER — Other Ambulatory Visit: Payer: Self-pay

## 2019-08-04 ENCOUNTER — Ambulatory Visit
Admission: RE | Admit: 2019-08-04 | Discharge: 2019-08-04 | Disposition: A | Discharge status: Home / Self Care | Payer: 59 | Source: Ambulatory Visit | Attending: Orthopaedic Surgery | Admitting: Orthopaedic Surgery

## 2019-08-04 DIAGNOSIS — M25551 Pain in right hip: Secondary | ICD-10-CM

## 2019-08-04 DIAGNOSIS — G8929 Other chronic pain: Secondary | ICD-10-CM

## 2019-08-09 ENCOUNTER — Ambulatory Visit (INDEPENDENT_AMBULATORY_CARE_PROVIDER_SITE_OTHER): Payer: 59 | Admitting: Orthopaedic Surgery

## 2019-08-09 ENCOUNTER — Encounter: Payer: Self-pay | Admitting: Orthopaedic Surgery

## 2019-08-09 VITALS — Ht 73.0 in | Wt 286.0 lb

## 2019-08-09 DIAGNOSIS — M5441 Lumbago with sciatica, right side: Secondary | ICD-10-CM

## 2019-08-09 DIAGNOSIS — G8929 Other chronic pain: Secondary | ICD-10-CM | POA: Diagnosis not present

## 2019-08-09 NOTE — Progress Notes (Signed)
Office Visit Note   Patient: [name]           Date of Birth: Oct 11, 1955           MRN: [id number] Visit Date: 08/09/2019              Requested by: [name], MD [address] [place],  [place] [address] PCP: [name], MD   Assessment & Plan: Visit Diagnoses:  1. Chronic right-sided low back pain with right-sided sciatica   2. Morbid (severe) obesity due to excess calories [facility])     Plan: MRI scan reveals diffuse degenerative changes lumbar spine.  There is mild spinal stenosis at L2-3, moderate at L3-4 and severe at L4-5 with subarticular stenosis bilaterally.  Mr. Verl Blalock relates that he has lost 20 pounds in the last several weeks and working with his primary care doctor with his insulin and feeling much better.  Long discussion regarding the MRI scan and we may expect over time.  He is doing so much better that for the moment we will try a course of physical therapy.  Might want to consider an epidural steroid at some point in the future.  All questions were answered.  Follow-Up Instructions: Return if symptoms worsen or fail to improve.   Orders:  Orders Placed This Encounter  Procedures  . Ambulatory referral to Physical Therapy   No orders of the defined types were placed in this encounter.     Procedures: No procedures performed   Clinical Data: No additional findings.   Subjective: Chief Complaint  Patient presents with  . Lower Back - Follow-up    MRI results  Patient presents today for a one month follow up on his lower back. He had an MRI on 08/04/2019 and is here today for those results. Patient states that he has lost 20 pounds in the last month and is hoping that will improve his back pain.  Has been working with his primary care physician to monitor his insulin.  With the new insulin regimen and diet he is lost 20 pounds as mentioned above.  Feeling better.  Not having as much trouble with his legs or his back.  HPI  Review of  Systems   Objective: Vital Signs: Ht 6\' 1"  (1.854 m)   Wt 286 lb (129.7 kg)   BMI 37.73 kg/m   Physical Exam Constitutional:      Appearance: He is well-developed.  Eyes:     Pupils: Pupils are equal, round, and reactive to light.  Pulmonary:     Effort: Pulmonary effort is normal.  Skin:    General: Skin is warm and dry.  Neurological:     Mental Status: He is alert and oriented to person, place, and time.  Psychiatric:        Behavior: Behavior normal.     Ortho Exam awake alert and oriented x3.  Comfortable sitting.  Straight leg raise negative.  Painless range of motion both hips.  No percussible tenderness of the lumbar spine.  Occasionally has some decreased sensibility to his great toes probably related to his diabetes but today in normal sensation.  No calf pain.  No knee pain  Specialty Comments:  No specialty comments available.  Imaging: No results found.   PMFS History: Patient Active Problem List   Diagnosis Date Noted  . Low back pain 07/12/2019  . Pain in right hip 07/12/2019  . Morbid (severe) obesity due to excess calories ([place]) 07/12/2019  Past Medical History:  Diagnosis Date  . Diabetes mellitus without complication ([place])   . DVT (deep venous thrombosis) ([place])   . Hypercholesteremia   . Hypertension   . Reflux     History reviewed. No pertinent family history.  Past Surgical History:  Procedure Laterality Date  . BUNIONECTOMY    . LIPOMA EXCISION     Social History   Occupational History  . Not on file  Tobacco Use  . Smoking status: Never Smoker  Substance and Sexual Activity  . Alcohol use: No  . Drug use: No  . Sexual activity: Not on file

## 2019-09-20 ENCOUNTER — Other Ambulatory Visit: Payer: Self-pay

## 2019-09-20 ENCOUNTER — Ambulatory Visit
Admission: EM | Admit: 2019-09-20 | Discharge: 2019-09-20 | Disposition: A | Discharge status: Home / Self Care | Payer: 59 | Attending: Emergency Medicine | Admitting: Emergency Medicine

## 2019-09-20 DIAGNOSIS — Z20822 Contact with and (suspected) exposure to covid-19: Secondary | ICD-10-CM

## 2019-09-20 DIAGNOSIS — R42 Dizziness and giddiness: Secondary | ICD-10-CM

## 2019-09-20 NOTE — ED Triage Notes (Signed)
Pt presents to UC for covid test. Pt denies covid sxs. States he was exposed to covid positive daughter.

## 2019-09-20 NOTE — ED Provider Notes (Signed)
[facility]   [id number] 09/20/19 Arrival Time: [id number]   CC: COVID symptoms  SUBJECTIVE: History from: patient.  [name] is a 64 y.o. male who presents for COVID testing.  Denies sick exposure to COVID, flu or strep.  Denies recent travel.  Denies aggravating or alleviating symptoms.  Denies previous COVID infection.   Denies fever, chills, fatigue, nasal congestion, rhinorrhea, sore throat, cough, SOB, wheezing, chest pain, nausea, vomiting, changes in bowel or bladder habits.    Also mentions a few episodes of lightheadedness while bending over today.  Recently transitioned from insulin to jardiance.  Speculates the jardiance may be making him feel this way, and/or he may be dehydrated.  States he does not drink enough water, and will eat only one 1200 calorie meal a day.  Will drink a diet tea when he feels hungry.  States sugar today was between 130 -160.  Spoke with PCP at the [facility] today and advised to get a COVID test.  Is followed closely by PCP for DM.  Presents here today just for COVID test.  Denies chest pain, SOB, abdominal pain, nausea, vomiting.    ROS: As per HPI.  All other pertinent ROS negative.     Past Medical History:  Diagnosis Date  . Diabetes mellitus without complication ([place])   . DVT (deep venous thrombosis) ([place])   . Hypercholesteremia   . Hypertension   . Reflux    Past Surgical History:  Procedure Laterality Date  . BUNIONECTOMY    . LIPOMA EXCISION     Allergies  Allergen Reactions  . Vicodin [Hydrocodone-Acetaminophen] Rash and Other (See Comments)    Sweating, Unconsciousness, fainting   . Niacin And Related   . Rabeprazole    No current facility-administered medications on file prior to encounter.   Current Outpatient Medications on File Prior to Encounter  Medication Sig Dispense Refill  . amLODipine (NORVASC) 10 MG tablet Take 10 mg by mouth daily.    Marland Kitchen aspirin EC 81 MG tablet Take 81 mg by mouth daily.    . clobetasol ointment  (TEMOVATE) [id number] % Apply 1 application topically 2 (two) times daily.    Marland Kitchen doxazosin (CARDURA) 2 MG tablet Take 2 mg by mouth daily.    . fish oil-omega-3 fatty acids 1000 MG capsule Take 1 g by mouth 2 (two) times daily.    . fluticasone (FLONASE) 50 MCG/ACT nasal spray Place into both nostrils daily.    . hydrochlorothiazide (HYDRODIURIL) 25 MG tablet Take 25 mg by mouth daily.    . insulin aspart (NOVOLOG FLEXPEN) 100 unit/mL SOLN FlexPen Inject 40 Units into the skin at bedtime.    . Insulin Glargine (LANTUS SOLOSTAR) 100 UNIT/ML SOPN Inject 60 Units into the skin at bedtime.    Marland Kitchen losartan (COZAAR) 100 MG tablet Take 100 mg by mouth daily.    . magnesium gluconate (MAGONATE) 500 MG tablet Take 1,000 mg by mouth every morning.    . metFORMIN (GLUCOPHAGE) 1000 MG tablet Take 1,000 mg by mouth 2 (two) times daily with a meal.    . Multiple Vitamins-Minerals (CENTRUM SILVER ADULT 50+ PO) Take 1 tablet by mouth every morning.    Marland Kitchen omeprazole (PRILOSEC) 40 MG capsule Take 40 mg by mouth 2 (two) times daily.    . rosuvastatin (CRESTOR) 10 MG tablet Take 10 mg by mouth daily.    . traMADol (ULTRAM) 50 MG tablet Take 1 tablet (50 mg total) by mouth every 6 (six) hours as  needed for pain. 20 tablet 0  . vitamin C (ASCORBIC ACID) 500 MG tablet Take 500 mg by mouth every morning.     Social History   Socioeconomic History  . Marital status: Married    Spouse name: Not on file  . Number of children: Not on file  . Years of education: Not on file  . Highest education level: Not on file  Occupational History  . Not on file  Tobacco Use  . Smoking status: Never Smoker  . Smokeless tobacco: Never Used  Substance and Sexual Activity  . Alcohol use: No  . Drug use: No  . Sexual activity: Not on file  Other Topics Concern  . Not on file  Social History Narrative  . Not on file   Social Determinants of Health   Financial Resource Strain:   . Difficulty of Paying Living Expenses: Not on file   Food Insecurity:   . Worried About Charity fundraiser in the Last Year: Not on file  . Ran Out of Food in the Last Year: Not on file  Transportation Needs:   . Lack of Transportation (Medical): Not on file  . Lack of Transportation (Non-Medical): Not on file  Physical Activity:   . Days of Exercise per Week: Not on file  . Minutes of Exercise per Session: Not on file  Stress:   . Feeling of Stress : Not on file  Social Connections:   . Frequency of Communication with Friends and Family: Not on file  . Frequency of Social Gatherings with Friends and Family: Not on file  . Attends Religious Services: Not on file  . Active Member of Clubs or Organizations: Not on file  . Attends Archivist Meetings: Not on file  . Marital Status: Not on file  Intimate Partner Violence:   . Fear of Current or Ex-Partner: Not on file  . Emotionally Abused: Not on file  . Physically Abused: Not on file  . Sexually Abused: Not on file   Family History  Problem Relation Age of Onset  . Healthy Mother   . Healthy Father     OBJECTIVE:  Vitals:   09/20/19 1554  BP: 130/83  Pulse: 80  Resp: 20  Temp: 98 F (36.7 C)  TempSrc: Oral  SpO2: 97%     General appearance: alert; well-appearing, nontoxic; speaking in full sentences and tolerating own secretions HEENT: NCAT; Ears: EACs clear, TMs pearly gray; Eyes: PERRL.  EOM grossly intact.Nose: nares patent without rhinorrhea, Throat: oropharynx clear, tonsils non erythematous or enlarged, uvula midline  Neck: supple without LAD Lungs: unlabored respirations, symmetrical air entry; cough: absent; no respiratory distress; CTAB Heart: regular rate and rhythm.  Skin: warm and dry Psychological: alert and cooperative; normal mood and affect  ASSESSMENT & PLAN:  1. Exposure to COVID-19 virus   2. Episodic lightheadedness    COVID testing ordered.  It will take between 2-5 days for test results.  Someone will contact you regarding abnormal  results.    In the meantime: You should remain isolated in your home for 10 days from symptom onset AND greater than 72 hours after symptoms resolution (absence of fever without the use of fever-reducing medication and improvement in respiratory symptoms), whichever is longer OR 14 days from exposure Get plenty of rest and push fluids Use OTC zyrtec for nasal congestion, runny nose, and/or sore throat Use OTC flonase for nasal congestion and runny nose Use medications daily for symptom relief Use  OTC medications like ibuprofen or tylenol as needed fever or pain Call or go to the ED if you have any new or worsening symptoms such as fever, cough, shortness of breath, chest tightness, chest pain, turning blue, changes in mental status, etc...   Will follow up with PCP today regarding lightheadedness, COVID test and DM management.    Reviewed expectations re: course of current medical issues. Questions answered. Outlined signs and symptoms indicating need for more acute intervention. Patient verbalized understanding. After Visit Summary given.         Lestine Box, PA-C 09/20/19 1616

## 2019-09-20 NOTE — Discharge Instructions (Addendum)
COVID testing ordered.  It will take between 2-5 days for test results.  Someone will contact you regarding abnormal results.    In the meantime: You should remain isolated in your home for 10 days from symptom onset AND greater than 72 hours after symptoms resolution (absence of fever without the use of fever-reducing medication and improvement in respiratory symptoms), whichever is longer OR 14 days from exposure Get plenty of rest and push fluids Use OTC zyrtec for nasal congestion, runny nose, and/or sore throat Use OTC flonase for nasal congestion and runny nose Use medications daily for symptom relief Use OTC medications like ibuprofen or tylenol as needed fever or pain Call or go to the ED if you have any new or worsening symptoms such as fever, cough, shortness of breath, chest tightness, chest pain, turning blue, changes in mental status, etc..Marland Kitchen

## 2019-09-21 LAB — NOVEL CORONAVIRUS, NAA: SARS-CoV-2, NAA: NOT DETECTED

## 2020-01-08 ENCOUNTER — Encounter: Payer: Self-pay | Admitting: Urology

## 2020-01-08 ENCOUNTER — Other Ambulatory Visit: Payer: Self-pay

## 2020-01-08 ENCOUNTER — Telehealth: Payer: Self-pay | Admitting: Urology

## 2020-01-08 ENCOUNTER — Ambulatory Visit (INDEPENDENT_AMBULATORY_CARE_PROVIDER_SITE_OTHER): Payer: No Typology Code available for payment source | Admitting: Urology

## 2020-01-08 VITALS — BP 131/75 | HR 72 | Temp 97.2°F | Ht 73.5 in | Wt 251.0 lb

## 2020-01-08 DIAGNOSIS — R972 Elevated prostate specific antigen [PSA]: Secondary | ICD-10-CM | POA: Insufficient documentation

## 2020-01-08 LAB — POCT URINALYSIS DIPSTICK
Bilirubin, UA: NEGATIVE
Blood, UA: NEGATIVE
Clarity, UA: NEGATIVE
Glucose, UA: POSITIVE — AB
Ketones, UA: NEGATIVE
Leukocytes, UA: NEGATIVE
Nitrite, UA: NEGATIVE
Protein, UA: NEGATIVE
Spec Grav, UA: >=1.03 — AB (ref 1.010–1.025)
Urobilinogen, UA: 0.2 E.U./dL
pH, UA: 5 (ref 5.0–8.0)

## 2020-01-08 MED ORDER — CLOTRIMAZOLE-BETAMETHASONE 1-0.05 % EX CREA: 1.0000 "application " | TOPICAL_CREAM | Freq: Two times a day (BID) | CUTANEOUS | 0 refills | Status: DC

## 2020-01-08 MED ORDER — LEVOFLOXACIN 750 MG PO TABS: 750.0000 mg | ORAL_TABLET | Freq: Once | ORAL | 0 refills | Status: AC

## 2020-01-08 MED ORDER — LEVOFLOXACIN 750 MG PO TABS: 750.0000 mg | ORAL_TABLET | Freq: Once | ORAL | 0 refills | Status: DC

## 2020-01-08 NOTE — Progress Notes (Signed)
01/08/2020 11:41 AM   [name] 10-06-1955 [id number]  Referring provider: [name], MD [address] [place],  [place] [address]  Elevated PSA  HPI: [name] is a 64yo here for evaluation of elevated PSA. PSA was 1 in 2006 and then 6.7 in 10/2019. No family hx of prostate cancer. He is on doxazsin for his urination. He has DMII and takes jardiance. No UTIs. No dysuria. He had a hematuria workup in 2006 which was negative per records He is not on any blood thinners  PMH: Past Medical History:  Diagnosis Date   Anxiety    Arthritis    Diabetes mellitus without complication ([place])    DVT (deep venous thrombosis) ([place])    Hypercholesteremia    Hypertension    Reflux    Sleep apnea     Surgical History: Past Surgical History:  Procedure Laterality Date   ANKLE SURGERY Left    BUNIONECTOMY     HEEL SPUR SURGERY     LIPOMA EXCISION     SHOULDER SURGERY     TONSILLECTOMY      Home Medications:  Allergies as of 01/08/2020      Reactions   Vicodin [hydrocodone-acetaminophen] Rash, Other (See Comments)   Sweating, Unconsciousness, fainting    Niacin Other (See Comments)   Warning- Niacin red skin and mild sweating   Niacin And Related    Rabeprazole       Medication List       Accurate as of January 08, 2020 11:41 AM. If you have any questions, ask your nurse or doctor.        amLODipine 10 MG tablet Commonly known as: NORVASC Take 10 mg by mouth daily. What changed: Another medication with the same name was removed. Continue taking this medication, and follow the directions you see here. Changed by: Nicolette Bang, MD   aspirin EC 81 MG tablet Take 81 mg by mouth daily.   Calcium Carb-Ergocalciferol 500-200 MG-UNIT Tabs Take by mouth.   CENTRUM SILVER ADULT 50+ PO Take 1 tablet by mouth every morning.   clobetasol ointment 0.05 % Commonly known as: TEMOVATE Apply 1 application topically 2 (two) times daily.   doxazosin 2 MG  tablet Commonly known as: CARDURA Take 2 mg by mouth daily.   Fenofibrate 150 MG Caps Take by mouth.   fish oil-omega-3 fatty acids 1000 MG capsule Take 1 g by mouth 2 (two) times daily.   fluticasone 50 MCG/ACT nasal spray Commonly known as: FLONASE Place into both nostrils daily.   hydrochlorothiazide 25 MG tablet Commonly known as: HYDRODIURIL Take 25 mg by mouth daily.   Jardiance 25 MG Tabs tablet Generic drug: empagliflozin Take by mouth.   ketoconazole 2 % cream Commonly known as: NIZORAL Apply 1 application topically daily.   Lantus SoloStar 100 UNIT/ML Solostar Pen Generic drug: insulin glargine Inject 60 Units into the skin at bedtime.   losartan 100 MG tablet Commonly known as: COZAAR Take 100 mg by mouth daily.   magnesium gluconate 500 MG tablet Commonly known as: MAGONATE Take 1,000 mg by mouth every morning.   magnesium oxide 400 MG tablet Commonly known as: MAG-OX Take by mouth.   metFORMIN 1000 MG tablet Commonly known as: GLUCOPHAGE Take 1,000 mg by mouth 2 (two) times daily with a meal.   NovoLOG FlexPen 100 UNIT/ML FlexPen Generic drug: insulin aspart Inject 40 Units into the skin at bedtime.   omeprazole 40 MG capsule Commonly known as: PRILOSEC  Take 40 mg by mouth 2 (two) times daily.   ranitidine 150 MG tablet Commonly known as: ZANTAC Take by mouth.   rosuvastatin 10 MG tablet Commonly known as: CRESTOR Take 10 mg by mouth daily.   simvastatin 10 MG tablet Commonly known as: ZOCOR Take by mouth.   traMADol 50 MG tablet Commonly known as: ULTRAM Take 1 tablet (50 mg total) by mouth every 6 (six) hours as needed for pain.   vitamin C 500 MG tablet Commonly known as: ASCORBIC ACID Take 500 mg by mouth every morning.   warfarin 10 MG tablet Commonly known as: COUMADIN Take by mouth.       Allergies:  Allergies  Allergen Reactions   Vicodin [Hydrocodone-Acetaminophen] Rash and Other (See Comments)    Sweating,  Unconsciousness, fainting    Niacin Other (See Comments)    Warning- Niacin red skin and mild sweating   Niacin And Related    Rabeprazole     Family History: Family History  Problem Relation Age of Onset   Healthy Mother    Parkinson's disease Father     Social History:  reports that he has never smoked. He has never used smokeless tobacco. He reports that he does not drink alcohol and does not use drugs.  ROS: All other review of systems were reviewed and are negative except what is noted above in HPI  Physical Exam: BP 131/75    Pulse 72    Temp (!) 97.2 F (36.2 C)    Ht 6' 1.5" (1.867 m)    Wt 251 lb (113.9 kg)    BMI 32.67 kg/m   Constitutional:  Alert and oriented, No acute distress. HEENT: [place] AT, moist mucus membranes.  Trachea midline, no masses. Cardiovascular: No clubbing, cyanosis, or edema. Respiratory: Normal respiratory effort, no increased work of breathing. GI: Abdomen is soft, nontender, nondistended, no abdominal masses GU: No CVA tenderness. UnCircumcised phallus. Balanitis present No masses/lesions on penis, testis, scrotum. Prostate 40g smooth no nodules no induration.  Lymph: No cervical or inguinal lymphadenopathy. Skin: No rashes, bruises or suspicious lesions. Neurologic: Grossly intact, no focal deficits, moving all 4 extremities. Psychiatric: Normal mood and affect.  Laboratory Data: Lab Results  Component Value Date   WBC 9.4 11/28/2012   HGB 14.5 11/28/2012   HCT 43.3 11/28/2012   MCV 87.8 11/28/2012   PLT 265 11/28/2012    Lab Results  Component Value Date   CREATININE 0.96 11/28/2012    No results found for: PSA  No results found for: TESTOSTERONE  No results found for: HGBA1C  Urinalysis    Component Value Date/Time   COLORURINE YELLOW 11/28/2012 [place] 11/28/2012 1654   LABSPEC 1.025 11/28/2012 1654   PHURINE 6.5 11/28/2012 1654   GLUCOSEU 250 (A) 11/28/2012 1654   HGBUR NEGATIVE 11/28/2012 1654    BILIRUBINUR NEGATIVE 11/28/2012 1654   KETONESUR NEGATIVE 11/28/2012 1654   PROTEINUR NEGATIVE 11/28/2012 1654   UROBILINOGEN 0.2 11/28/2012 1654   NITRITE NEGATIVE 11/28/2012 1654   LEUKOCYTESUR NEGATIVE 11/28/2012 1654    No results found for: LABMICR, WBCUA, RBCUA, LABEPIT, MUCUS, BACTERIA  Pertinent Imaging:  No results found for this or any previous visit.  Results for orders placed during the hospital encounter of 02/07/15  US Venous Img Lower Bilateral  Narrative CLINICAL DATA:  History of prior DVT involving the bilateral lower legs. Evaluate for acute or chronic DVT.  EXAM: BILATERAL LOWER EXTREMITY VENOUS DOPPLER ULTRASOUND  TECHNIQUE: Gray-scale sonography with  graded compression, as well as color Doppler and duplex ultrasound were performed to evaluate the lower extremity deep venous systems from the level of the common femoral vein and including the common femoral, femoral, profunda femoral, popliteal and calf veins including the posterior tibial, peroneal and gastrocnemius veins when visible. The superficial great saphenous vein was also interrogated. Spectral Doppler was utilized to evaluate flow at rest and with distal augmentation maneuvers in the common femoral, femoral and popliteal veins.  COMPARISON:  None.  FINDINGS: RIGHT LOWER EXTREMITY  Common Femoral Vein: No evidence of thrombus. Normal compressibility, respiratory phasicity and response to augmentation.  Saphenofemoral Junction: No evidence of thrombus. Normal compressibility and flow on color Doppler imaging.  Profunda Femoral Vein: No evidence of thrombus. Normal compressibility and flow on color Doppler imaging.  Femoral Vein: No evidence of thrombus. Normal compressibility, respiratory phasicity and response to augmentation.  Popliteal Vein: There is a very minimal amount of eccentric nonocclusive hypoechoic wall thickening / chronic DVT within the right popliteal vein (image  24).  Calf Veins: No evidence of thrombus. Normal compressibility and flow on color Doppler imaging.  Superficial Great Saphenous Vein: No evidence of thrombus. Normal compressibility and flow on color Doppler imaging.  Venous Reflux:  None.  Other Findings:  None.  LEFT LOWER EXTREMITY  Common Femoral Vein: No evidence of thrombus. Normal compressibility, respiratory phasicity and response to augmentation.  Saphenofemoral Junction: No evidence of thrombus. Normal compressibility and flow on color Doppler imaging.  Profunda Femoral Vein: No evidence of thrombus. Normal compressibility and flow on color Doppler imaging.  Femoral Vein: No evidence of thrombus. Normal compressibility, respiratory phasicity and response to augmentation.  Popliteal Vein: No evidence of thrombus. Normal compressibility, respiratory phasicity and response to augmentation.  Calf Veins: No evidence of thrombus. Normal compressibility and flow on color Doppler imaging.  Superficial Great Saphenous Vein: No evidence of thrombus. Normal compressibility and flow on color Doppler imaging.  Venous Reflux:  None.  Other Findings:  None.  IMPRESSION: 1. No evidence of acute or chronic DVT within the left lower extremity. 2. No evidence of acute DVT within the right lower extremity. 3. Suspected tiny amount of nonocclusive wall thickening/chronic DVT within the right popliteal vein.   Electronically Signed By: Sandi Mariscal M.D. On: 02/07/2015 16:47  No results found for this or any previous visit.  No results found for this or any previous visit.  No results found for this or any previous visit.  No results found for this or any previous visit.  No results found for this or any previous visit.  No results found for this or any previous visit.   Assessment & Plan:    1. Elevated PSA -We discussed the natural hx of elevated PSA and various causes of elevated PSA. We discuss PSA  surveillance versus prostate biopsy and the patient elects for protastate biopsy. Rx for levaquin sent to pharmacy - POCT urinalysis dipstick  2. Balanitis -clotrimazole BID for 21 days   No follow-ups on file.  Nicolette Bang, MD  [facility] [place]

## 2020-01-08 NOTE — Patient Instructions (Addendum)
Transrectal Ultrasound-Guided Prostate Biopsy A transrectal ultrasound-guided prostate biopsy is a procedure to remove samples of prostate tissue for testing. The procedure uses ultrasound images to guide the process of removing the samples. The samples are taken to a lab to be checked for prostate cancer. This procedure is usually done to evaluate the prostate gland of men who have raised (elevated) levels of prostate-specific antigen (PSA), which can be a sign of prostate cancer. Tell a health care provider about:  Any allergies you have.  All medicines you are taking, including vitamins, herbs, eye drops, creams, and over-the-counter medicines.  Any problems you or family members have had with anesthetic medicines.  Any blood disorders you have.  Any surgeries you have had.  Any medical conditions you have. What are the risks? Generally, this is a safe procedure. However, problems may occur, including:  Prostate infection.  Bleeding from the rectum.  Blood in the urine.  Allergic reactions to medicines.  Damage to surrounding structures such as blood vessels, organs, or muscles.  Difficulty passing urine.  Nerve damage. This is usually temporary.  Pain. What happens before the procedure? Staying hydrated Follow instructions from your health care provider about hydration, which may include:  Up to 2 hours before the procedure - you may continue to drink clear liquids, such as water, clear fruit juice, black coffee, and plain tea.  Eating and drinking restrictions Follow instructions from your health care provider about eating and drinking, which may include:  8 hours before the procedure - stop eating heavy meals or foods such as meat, fried foods, or fatty foods.  6 hours before the procedure - stop eating light meals or foods, such as toast or cereal.  6 hours before the procedure - stop drinking milk or drinks that contain milk.  2 hours before the procedure -  stop drinking clear liquids. Medicines Ask your health care provider about:  Changing or stopping your regular medicines. This is especially important if you are taking diabetes medicines or blood thinners.  Taking over-the-counter medicines, vitamins, herbs, and supplements.  Taking medicines such as aspirin and ibuprofen. These medicines can thin your blood. Do not take these medicines unless your health care provider tells you to take them. General instructions  You may be given antibiotic medicine to help prevent infection. If so, take the antibiotic as told by your health care provider.  You will be given an enema. During an enema, a liquid is injected into your rectum to clear out waste.  You may have a blood or urine sample taken.  Plan to have someone take you home from the hospital or clinic. What happens during the procedure?   To lower your risk of infection: ? Your health care team will wash or sanitize their hands. ? Hair may be removed from the surgical area. ? Your skin will be cleaned with a germ-killing soap. ? You may be given antibiotic medicine.  An IV will be inserted into one of your veins.  You will be given one or both of the following: ? A medicine to help you relax (sedative). ? A medicine to numb the area (local anesthetic).  You will be placed on your left side, and your knees will be bent toward your chest.  A probe with lubricated gel will be placed into your rectum, and images will be taken of your prostate and surrounding structures.  Numbing medicine will be injected into your prostate.  A biopsy needle will be inserted through  your rectum and guided to your prostate using the ultrasound images.  Prostate tissue samples will be removed, and the needle will then be removed.  The biopsy samples will be sent to a lab to be tested. The procedure may vary among health care providers and hospitals. What happens after the procedure?  Your blood  pressure, heart rate, breathing rate, and blood oxygen level will be monitored until the medicines you were given have worn off.  You may have some discomfort in the rectal area. You will be given pain medicine as needed.  Do not drive for 24 hours if you received a sedative. Summary  A transrectal ultrasound-guided biopsy removes samples of tissue from your prostate.  This procedure is usually done to evaluate the prostate gland of men who have raised (elevated) levels of prostate-specific antigen (PSA), which can be a sign of prostate cancer.  After your procedure, you may feel some discomfort in the rectal area.  Plan to have someone take you home from the hospital or clinic. This information is not intended to replace advice given to you by your health care provider. Make sure you discuss any questions you have with your health care provider. Document Revised: 10/19/2018 Document Reviewed: 09/25/2016 Elsevier Patient Education  [place].   Patient Name: Appointment Time Appointment Date:  Location: [facility]   Prostate Biopsy Instructions  Stop all aspirin or blood thinners (aspirin, plavix, coumadin, warfarin, motrin, ibuprofen, advil, aleve, naproxen, naprosyn) for 7 days prior to the procedure.  If you have any questions about stopping these medications, please contact your primary care physician or cardiologist.  Having a light meal prior to the procedure is recommended.  If you are diabetic or have low blood sugar please bring a small snack or glucose tablet.  A Fleets enema is needed to be purchased over the counter at a local pharmacy and used 2 hours before you scheduled appointment.  This can be purchased over the counter at any pharmacy.  Antibiotics will be administered in the clinic at the time of the procedure and 1 tablet has been sent to your pharmacy. Please take the antibiotic as prescribed.    Please bring someone with you to  the procedure to drive you home.   If you have any questions or concerns, please feel free to call the office at [phone] or send a Mychart message.    Thank you, [facility]

## 2020-01-08 NOTE — Progress Notes (Signed)
Urological Symptom Review  Patient is experiencing the following symptoms: Frequent urination Get up at night to urinate Leakage of urine Stream starts and stops   Review of Systems  Gastrointestinal (upper)  : Indigestion/heartburn  Gastrointestinal (lower) : Negative for lower GI symptoms  Constitutional : Negative for symptoms  Skin: Itching  Eyes: Negative for eye symptoms  Ear/Nose/Throat : Sinus problems  Hematologic/Lymphatic: Negative for Hematologic/Lymphatic symptoms  Cardiovascular : Negative for cardiovascular symptoms  Respiratory : Negative for respiratory symptoms  Endocrine: Negative for endocrine symptoms  Musculoskeletal: Back pain Joint pain  Neurological: Negative for neurological symptoms  Psychologic: Negative for psychiatric symptoms

## 2020-01-08 NOTE — Telephone Encounter (Signed)
Pt called and LM regarding prescription issue at the pharmacy.Asked for a return call.

## 2020-01-09 ENCOUNTER — Other Ambulatory Visit: Payer: Self-pay | Admitting: Urology

## 2020-01-09 ENCOUNTER — Other Ambulatory Visit: Payer: Self-pay

## 2020-01-09 DIAGNOSIS — R972 Elevated prostate specific antigen [PSA]: Secondary | ICD-10-CM

## 2020-01-09 MED ORDER — CLOTRIMAZOLE-BETAMETHASONE 1-0.05 % EX CREA: 1.0000 "application " | TOPICAL_CREAM | Freq: Two times a day (BID) | CUTANEOUS | 0 refills | Status: DC

## 2020-01-09 NOTE — Telephone Encounter (Signed)
Returned pt. Call. Pt said there was not a rx sent to [facility] for a cream he was supposed to get. Rx was originally sent to [facility]. [facility] resent to [facility] in [place]. Pt. Notified.

## 2020-01-30 ENCOUNTER — Telehealth: Payer: Self-pay | Admitting: Urology

## 2020-01-30 NOTE — Telephone Encounter (Signed)
Pt left vm stating he has questions about his procedure tomorrow morning. He was told it was going to be at a different location and he does not know when to take his medication.

## 2020-01-30 NOTE — Telephone Encounter (Signed)
Clarified with pt to arrive at the hospital for biopsy.

## 2020-01-31 ENCOUNTER — Other Ambulatory Visit: Payer: Self-pay | Admitting: Urology

## 2020-01-31 ENCOUNTER — Ambulatory Visit (HOSPITAL_BASED_OUTPATIENT_CLINIC_OR_DEPARTMENT_OTHER): Payer: No Typology Code available for payment source | Admitting: Urology

## 2020-01-31 ENCOUNTER — Other Ambulatory Visit: Payer: Self-pay

## 2020-01-31 ENCOUNTER — Encounter: Payer: Self-pay | Admitting: Urology

## 2020-01-31 ENCOUNTER — Encounter (HOSPITAL_COMMUNITY): Payer: Self-pay

## 2020-01-31 ENCOUNTER — Ambulatory Visit (HOSPITAL_COMMUNITY)
Admission: RE | Admit: 2020-01-31 | Discharge: 2020-01-31 | Disposition: A | Discharge status: Home / Self Care | Payer: No Typology Code available for payment source | Source: Ambulatory Visit | Attending: Urology | Admitting: Urology

## 2020-01-31 DIAGNOSIS — R972 Elevated prostate specific antigen [PSA]: Secondary | ICD-10-CM

## 2020-01-31 DIAGNOSIS — A4151 Sepsis due to Escherichia coli [E. coli]: Secondary | ICD-10-CM | POA: Diagnosis not present

## 2020-01-31 DIAGNOSIS — R509 Fever, unspecified: Secondary | ICD-10-CM | POA: Diagnosis not present

## 2020-01-31 MED ORDER — LIDOCAINE HCL (PF) 2 % IJ SOLN
INTRAMUSCULAR | Status: AC
  Administered 2020-01-31: 10 mL
  Filled 2020-01-31: qty 10

## 2020-01-31 MED ORDER — GENTAMICIN SULFATE 40 MG/ML IJ SOLN
INTRAMUSCULAR | Status: AC
  Administered 2020-01-31: 80 mg via INTRAMUSCULAR
  Filled 2020-01-31: qty 2

## 2020-01-31 MED ORDER — GENTAMICIN SULFATE 40 MG/ML IJ SOLN: 80.0000 mg | Freq: Once | INTRAMUSCULAR | Status: AC

## 2020-01-31 NOTE — Progress Notes (Signed)
Prostate Biopsy Procedure   Informed consent was obtained after discussing risks/benefits of the procedure.  A time out was performed to ensure correct patient identity.  Pre-Procedure: - Last PSA Level: No results found for: PSA - Gentamicin given prophylactically - Levaquin 500 mg administered PO -Transrectal Ultrasound performed revealing a 50.1 gm prostate -No significant hypoechoic or median lobe noted  Procedure: - Prostate block performed using 10 cc 1% lidocaine and biopsies taken from sextant areas, a total of 12 under ultrasound guidance.  Post-Procedure: - Patient tolerated the procedure well - He was counseled to seek immediate medical attention if experiences any severe pain, significant bleeding, or fevers - Return in one week to discuss biopsy results

## 2020-01-31 NOTE — Patient Instructions (Signed)

## 2020-02-02 NOTE — Progress Notes (Signed)
F/u appt made

## 2020-02-02 NOTE — Telephone Encounter (Signed)
-----   Message from Cleon Gustin, MD sent at 02/02/2020  8:37 AM EDT ----- He needs to see me for pathology discussion ----- Message ----- From: Iris Pert, LPN Sent: [id number]   2:55 PM EDT To: Cleon Gustin, MD  Please review

## 2020-02-02 NOTE — Telephone Encounter (Signed)
F/u appt made

## 2020-02-03 ENCOUNTER — Encounter (HOSPITAL_COMMUNITY): Payer: Self-pay | Admitting: Emergency Medicine

## 2020-02-03 ENCOUNTER — Inpatient Hospital Stay (HOSPITAL_COMMUNITY)
Admission: EM | Admit: 2020-02-03 | Discharge: 2020-02-06 | DRG: 872 | Disposition: A | Discharge status: Home / Self Care | Payer: No Typology Code available for payment source | Attending: Internal Medicine | Admitting: Internal Medicine

## 2020-02-03 ENCOUNTER — Other Ambulatory Visit: Payer: Self-pay

## 2020-02-03 ENCOUNTER — Emergency Department (HOSPITAL_COMMUNITY): Payer: No Typology Code available for payment source

## 2020-02-03 DIAGNOSIS — K219 Gastro-esophageal reflux disease without esophagitis: Secondary | ICD-10-CM | POA: Diagnosis present

## 2020-02-03 DIAGNOSIS — M1990 Unspecified osteoarthritis, unspecified site: Secondary | ICD-10-CM | POA: Diagnosis present

## 2020-02-03 DIAGNOSIS — I10 Essential (primary) hypertension: Secondary | ICD-10-CM | POA: Diagnosis present

## 2020-02-03 DIAGNOSIS — Z1623 Resistance to quinolones and fluoroquinolones: Secondary | ICD-10-CM | POA: Diagnosis present

## 2020-02-03 DIAGNOSIS — Z6832 Body mass index (BMI) 32.0-32.9, adult: Secondary | ICD-10-CM | POA: Diagnosis not present

## 2020-02-03 DIAGNOSIS — Z885 Allergy status to narcotic agent status: Secondary | ICD-10-CM | POA: Diagnosis not present

## 2020-02-03 DIAGNOSIS — Z7984 Long term (current) use of oral hypoglycemic drugs: Secondary | ICD-10-CM

## 2020-02-03 DIAGNOSIS — A4151 Sepsis due to Escherichia coli [E. coli]: Secondary | ICD-10-CM | POA: Diagnosis present

## 2020-02-03 DIAGNOSIS — Z79899 Other long term (current) drug therapy: Secondary | ICD-10-CM | POA: Diagnosis not present

## 2020-02-03 DIAGNOSIS — E6609 Other obesity due to excess calories: Secondary | ICD-10-CM | POA: Diagnosis present

## 2020-02-03 DIAGNOSIS — G4730 Sleep apnea, unspecified: Secondary | ICD-10-CM | POA: Diagnosis present

## 2020-02-03 DIAGNOSIS — E785 Hyperlipidemia, unspecified: Secondary | ICD-10-CM | POA: Diagnosis present

## 2020-02-03 DIAGNOSIS — R7881 Bacteremia: Secondary | ICD-10-CM | POA: Diagnosis not present

## 2020-02-03 DIAGNOSIS — R972 Elevated prostate specific antigen [PSA]: Secondary | ICD-10-CM | POA: Diagnosis present

## 2020-02-03 DIAGNOSIS — E1165 Type 2 diabetes mellitus with hyperglycemia: Secondary | ICD-10-CM | POA: Diagnosis present

## 2020-02-03 DIAGNOSIS — Z888 Allergy status to other drugs, medicaments and biological substances status: Secondary | ICD-10-CM

## 2020-02-03 DIAGNOSIS — Z7982 Long term (current) use of aspirin: Secondary | ICD-10-CM

## 2020-02-03 DIAGNOSIS — Z20822 Contact with and (suspected) exposure to covid-19: Secondary | ICD-10-CM | POA: Diagnosis present

## 2020-02-03 DIAGNOSIS — R509 Fever, unspecified: Secondary | ICD-10-CM | POA: Diagnosis present

## 2020-02-03 DIAGNOSIS — A419 Sepsis, unspecified organism: Secondary | ICD-10-CM | POA: Diagnosis present

## 2020-02-03 DIAGNOSIS — N390 Urinary tract infection, site not specified: Secondary | ICD-10-CM | POA: Diagnosis present

## 2020-02-03 DIAGNOSIS — F419 Anxiety disorder, unspecified: Secondary | ICD-10-CM | POA: Diagnosis present

## 2020-02-03 DIAGNOSIS — N400 Enlarged prostate without lower urinary tract symptoms: Secondary | ICD-10-CM | POA: Diagnosis present

## 2020-02-03 LAB — URINALYSIS, ROUTINE W REFLEX MICROSCOPIC
Bilirubin Urine: NEGATIVE
Glucose, UA: >=500 mg/dL — AB
Ketones, ur: 5 mg/dL — AB
Leukocytes,Ua: NEGATIVE
Nitrite: NEGATIVE
Protein, ur: NEGATIVE mg/dL
RBC / HPF: >50 RBC/hpf — ABNORMAL HIGH (ref 0–5)
Specific Gravity, Urine: 1.031 — ABNORMAL HIGH (ref 1.005–1.030)
WBC, UA: >50 WBC/hpf — ABNORMAL HIGH (ref 0–5)
pH: 5 (ref 5.0–8.0)

## 2020-02-03 LAB — CBC WITH DIFFERENTIAL/PLATELET
Abs Immature Granulocytes: 0.05 10*3/uL (ref 0.00–0.07)
Basophils Absolute: 0 10*3/uL (ref 0.0–0.1)
Basophils Relative: 0 %
Eosinophils Absolute: 0 10*3/uL (ref 0.0–0.5)
Eosinophils Relative: 0 %
HCT: 43.6 % (ref 39.0–52.0)
Hemoglobin: 14.1 g/dL (ref 13.0–17.0)
Immature Granulocytes: 1 %
Lymphocytes Relative: 4 %
Lymphs Abs: 0.4 10*3/uL — ABNORMAL LOW (ref 0.7–4.0)
MCH: 29.3 pg (ref 26.0–34.0)
MCHC: 32.3 g/dL (ref 30.0–36.0)
MCV: 90.6 fL (ref 80.0–100.0)
Monocytes Absolute: 0.2 10*3/uL (ref 0.1–1.0)
Monocytes Relative: 2 %
Neutro Abs: 9.9 10*3/uL — ABNORMAL HIGH (ref 1.7–7.7)
Neutrophils Relative %: 93 %
Platelets: 161 10*3/uL (ref 150–400)
RBC: 4.81 MIL/uL (ref 4.22–5.81)
RDW: 13.4 % (ref 11.5–15.5)
WBC: 10.6 10*3/uL — ABNORMAL HIGH (ref 4.0–10.5)
nRBC: 0 % (ref 0.0–0.2)

## 2020-02-03 LAB — COMPREHENSIVE METABOLIC PANEL
ALT: 18 U/L (ref 0–44)
AST: 16 U/L (ref 15–41)
Albumin: 3.7 g/dL (ref 3.5–5.0)
Alkaline Phosphatase: 95 U/L (ref 38–126)
Anion gap: 11 (ref 5–15)
BUN: 17 mg/dL (ref 8–23)
CO2: 19 mmol/L — ABNORMAL LOW (ref 22–32)
Calcium: 8.5 mg/dL — ABNORMAL LOW (ref 8.9–10.3)
Chloride: 104 mmol/L (ref 98–111)
Creatinine, Ser: 0.9 mg/dL (ref 0.61–1.24)
GFR calc Af Amer: >60 mL/min (ref >60)
GFR calc non Af Amer: >60 mL/min (ref >60)
Glucose, Bld: 293 mg/dL — ABNORMAL HIGH (ref 70–99)
Potassium: 3.7 mmol/L (ref 3.5–5.1)
Sodium: 134 mmol/L — ABNORMAL LOW (ref 135–145)
Total Bilirubin: 1 mg/dL (ref 0.3–1.2)
Total Protein: 6.6 g/dL (ref 6.5–8.1)

## 2020-02-03 LAB — PROTIME-INR
INR: 1.1 (ref 0.8–1.2)
Prothrombin Time: 13.5 seconds (ref 11.4–15.2)

## 2020-02-03 LAB — HEMOGLOBIN A1C
Hgb A1c MFr Bld: 7.9 % — ABNORMAL HIGH (ref 4.8–5.6)
Mean Plasma Glucose: 180.03 mg/dL

## 2020-02-03 LAB — LACTIC ACID, PLASMA
Lactic Acid, Venous: 3.3 mmol/L (ref 0.5–1.9)
Lactic Acid, Venous: 1.5 mmol/L (ref 0.5–1.9)

## 2020-02-03 LAB — PHOSPHORUS: Phosphorus: 4.5 mg/dL (ref 2.5–4.6)

## 2020-02-03 LAB — PROCALCITONIN: Procalcitonin: 1.93 ng/mL

## 2020-02-03 LAB — SARS CORONAVIRUS 2 BY RT PCR (HOSPITAL ORDER, PERFORMED IN ~~LOC~~ HOSPITAL LAB): SARS Coronavirus 2: NEGATIVE

## 2020-02-03 LAB — MAGNESIUM: Magnesium: 1.9 mg/dL (ref 1.7–2.4)

## 2020-02-03 LAB — TSH: TSH: 0.826 u[IU]/mL (ref 0.350–4.500)

## 2020-02-03 LAB — CBG MONITORING, ED
Glucose-Capillary: 134 mg/dL — ABNORMAL HIGH (ref 70–99)
Glucose-Capillary: 148 mg/dL — ABNORMAL HIGH (ref 70–99)
Glucose-Capillary: 150 mg/dL — ABNORMAL HIGH (ref 70–99)
Glucose-Capillary: 284 mg/dL — ABNORMAL HIGH (ref 70–99)

## 2020-02-03 LAB — APTT: aPTT: 26 seconds (ref 24–36)

## 2020-02-03 LAB — GLUCOSE, CAPILLARY: Glucose-Capillary: 112 mg/dL — ABNORMAL HIGH (ref 70–99)

## 2020-02-03 LAB — HIV ANTIBODY (ROUTINE TESTING W REFLEX): HIV Screen 4th Generation wRfx: NONREACTIVE

## 2020-02-03 MED ORDER — ROSUVASTATIN CALCIUM 10 MG PO TABS: 10.0000 mg | ORAL_TABLET | Freq: Every day | ORAL | Status: DC

## 2020-02-03 MED ORDER — SODIUM CHLORIDE 0.9 % IV SOLN: INTRAVENOUS | Status: AC

## 2020-02-03 MED ORDER — VANCOMYCIN HCL 1250 MG/250ML IV SOLN
1250.0000 mg | Freq: Two times a day (BID) | INTRAVENOUS | Status: DC
  Administered 2020-02-03 – 2020-02-04 (×2): 1250 mg via INTRAVENOUS
  Filled 2020-02-03 (×2): qty 250

## 2020-02-03 MED ORDER — ENOXAPARIN SODIUM 60 MG/0.6ML ~~LOC~~ SOLN
55.0000 mg | SUBCUTANEOUS | Status: DC
  Administered 2020-02-03 – 2020-02-06 (×4): 55 mg via SUBCUTANEOUS
  Filled 2020-02-03 (×4): qty 0.6

## 2020-02-03 MED ORDER — IOHEXOL 300 MG/ML  SOLN
100.0000 mL | Freq: Once | INTRAMUSCULAR | Status: AC | PRN
  Administered 2020-02-03: 100 mL via INTRAVENOUS

## 2020-02-03 MED ORDER — ONDANSETRON HCL 4 MG/2ML IJ SOLN: 4.0000 mg | Freq: Four times a day (QID) | INTRAMUSCULAR | Status: DC | PRN

## 2020-02-03 MED ORDER — ASCORBIC ACID 500 MG PO TABS
500.0000 mg | ORAL_TABLET | Freq: Every morning | ORAL | Status: DC
  Administered 2020-02-03 – 2020-02-06 (×4): 500 mg via ORAL
  Filled 2020-02-03 (×4): qty 1

## 2020-02-03 MED ORDER — INSULIN ASPART 100 UNIT/ML ~~LOC~~ SOLN
0.0000 [IU] | Freq: Every day | SUBCUTANEOUS | Status: DC
  Administered 2020-02-05: 2 [IU] via SUBCUTANEOUS

## 2020-02-03 MED ORDER — LACTATED RINGERS IV BOLUS (SEPSIS)
1000.0000 mL | Freq: Once | INTRAVENOUS | Status: AC
  Administered 2020-02-03: 1000 mL via INTRAVENOUS

## 2020-02-03 MED ORDER — ACETAMINOPHEN 325 MG PO TABS
650.0000 mg | ORAL_TABLET | Freq: Four times a day (QID) | ORAL | Status: DC | PRN
  Administered 2020-02-03 – 2020-02-06 (×8): 650 mg via ORAL
  Filled 2020-02-03 (×9): qty 2

## 2020-02-03 MED ORDER — ROSUVASTATIN CALCIUM 10 MG PO TABS
10.0000 mg | ORAL_TABLET | ORAL | Status: DC
  Administered 2020-02-04: 10 mg via ORAL
  Filled 2020-02-03 (×2): qty 1

## 2020-02-03 MED ORDER — ONDANSETRON HCL 4 MG PO TABS: 4.0000 mg | ORAL_TABLET | Freq: Four times a day (QID) | ORAL | Status: DC | PRN

## 2020-02-03 MED ORDER — DOXAZOSIN MESYLATE 2 MG PO TABS
2.0000 mg | ORAL_TABLET | Freq: Every day | ORAL | Status: DC
  Administered 2020-02-04 – 2020-02-06 (×3): 2 mg via ORAL
  Filled 2020-02-03 (×6): qty 1

## 2020-02-03 MED ORDER — ASPIRIN EC 81 MG PO TBEC
81.0000 mg | DELAYED_RELEASE_TABLET | Freq: Every day | ORAL | Status: DC
  Administered 2020-02-03 – 2020-02-06 (×4): 81 mg via ORAL
  Filled 2020-02-03 (×4): qty 1

## 2020-02-03 MED ORDER — ACETAMINOPHEN 650 MG RE SUPP: 650.0000 mg | Freq: Four times a day (QID) | RECTAL | Status: DC | PRN

## 2020-02-03 MED ORDER — SODIUM CHLORIDE 0.9 % IV SOLN
2.0000 g | Freq: Once | INTRAVENOUS | Status: AC
  Administered 2020-02-03: 2 g via INTRAVENOUS
  Filled 2020-02-03: qty 2

## 2020-02-03 MED ORDER — ACETAMINOPHEN 325 MG PO TABS
650.0000 mg | ORAL_TABLET | Freq: Once | ORAL | Status: AC
  Administered 2020-02-03: 650 mg via ORAL
  Filled 2020-02-03: qty 2

## 2020-02-03 MED ORDER — VANCOMYCIN HCL 2000 MG/400ML IV SOLN
2000.0000 mg | Freq: Once | INTRAVENOUS | Status: AC
  Administered 2020-02-03: 2000 mg via INTRAVENOUS
  Filled 2020-02-03: qty 400

## 2020-02-03 MED ORDER — MAGNESIUM GLUCONATE 500 MG PO TABS
1000.0000 mg | ORAL_TABLET | Freq: Every morning | ORAL | Status: DC
  Filled 2020-02-03 (×4): qty 2

## 2020-02-03 MED ORDER — VANCOMYCIN HCL IN DEXTROSE 1-5 GM/200ML-% IV SOLN: 1000.0000 mg | Freq: Once | INTRAVENOUS | Status: DC

## 2020-02-03 MED ORDER — ENOXAPARIN SODIUM 40 MG/0.4ML ~~LOC~~ SOLN: 40.0000 mg | SUBCUTANEOUS | Status: DC

## 2020-02-03 MED ORDER — SODIUM CHLORIDE 0.9 % IV SOLN
2.0000 g | Freq: Three times a day (TID) | INTRAVENOUS | Status: DC
  Administered 2020-02-03 (×3): 2 g via INTRAVENOUS
  Filled 2020-02-03 (×4): qty 2

## 2020-02-03 MED ORDER — INSULIN ASPART 100 UNIT/ML ~~LOC~~ SOLN
0.0000 [IU] | Freq: Three times a day (TID) | SUBCUTANEOUS | Status: DC
  Administered 2020-02-03: 2 [IU] via SUBCUTANEOUS
  Administered 2020-02-04 (×2): 3 [IU] via SUBCUTANEOUS
  Administered 2020-02-04: 2 [IU] via SUBCUTANEOUS
  Administered 2020-02-05 – 2020-02-06 (×4): 3 [IU] via SUBCUTANEOUS
  Filled 2020-02-03: qty 1

## 2020-02-03 MED ORDER — FLUTICASONE PROPIONATE 50 MCG/ACT NA SUSP
1.0000 | Freq: Every day | NASAL | Status: DC
  Administered 2020-02-03 – 2020-02-06 (×4): 1 via NASAL
  Filled 2020-02-03 (×2): qty 16

## 2020-02-03 MED ORDER — METRONIDAZOLE IN NACL 5-0.79 MG/ML-% IV SOLN
500.0000 mg | Freq: Once | INTRAVENOUS | Status: AC
  Administered 2020-02-03: 500 mg via INTRAVENOUS
  Filled 2020-02-03: qty 100

## 2020-02-03 NOTE — Progress Notes (Signed)
Pharmacy Antibiotic Note  [name] is a 64 y.o. male admitted on 02/03/2020 with sepsis.  Pharmacy has been consulted for vancomycin and cefepime dosing. Cefepime 2gm ordered in ED.  Changed initial vancomycin dose to 2gm  Plan: Continue cefepime 2gm IV q8 hours Cont vancomycin 1250 mg IV q12 hours F/u renal function, cultures and clincial course  Height: 6' 1.5" (186.7 cm) Weight: (!) 113.6 kg (250 lb 8 oz) IBW/kg (Calculated) : 81.05  Temp (24hrs), Avg:102.7 F (39.3 C), Min:102.7 F (39.3 C), Max:102.7 F (39.3 C)  No results for input(s): WBC, CREATININE, LATICACIDVEN, VANCOTROUGH, VANCOPEAK, VANCORANDOM, GENTTROUGH, GENTPEAK, GENTRANDOM, TOBRATROUGH, TOBRAPEAK, TOBRARND, AMIKACINPEAK, AMIKACINTROU, AMIKACIN in the last 168 hours.  CrCl cannot be calculated (Patient's most recent lab result is older than the maximum 21 days allowed.).    Allergies  Allergen Reactions  . Vicodin [Hydrocodone-Acetaminophen] Rash and Other (See Comments)    Sweating, Unconsciousness, fainting   . Niacin Other (See Comments)    Warning- Niacin red skin and mild sweating  . Niacin And Related   . Rabeprazole     Thank you for allowing pharmacy to be a part of this patient's care.  [name] 02/03/2020 12:53 AM

## 2020-02-03 NOTE — ED Notes (Signed)
ED TO INPATIENT HANDOFF REPORT  ED Nurse Name and Phone #: [phone]  S Name/Age/Gender [name] 64 y.o. male Room/Bed: APA06/APA06  Code Status   Code Status: Full Code  Home/SNF/Other Home Patient oriented to: self, place, time and situation Is this baseline? Yes   Triage Complete: Triage complete  Chief Complaint Sepsis [facility]) [A41.9]  Triage Note Pt called EMS for diabetic issue. On arrival pt had fever of 103 oral, CBG of 283, and generalized weakness. Pt says that they were concerned that his glucose was low before calling EMS so wife gave him a candy bar and some orange juice.     Allergies Allergies  Allergen Reactions  . Vicodin [Hydrocodone-Acetaminophen] Rash and Other (See Comments)    Sweating, Unconsciousness, fainting   . Niacin Other (See Comments)    Warning- Niacin red skin and mild sweating  . Niacin And Related   . Rabeprazole     Level of Care/Admitting Diagnosis ED Disposition    ED Disposition Condition [place]: [facility] [[id number]]  Level of Care: Telemetry [5]  Covid Evaluation: Symptomatic Person Under Investigation (PUI)  Diagnosis: Sepsis [facility]) [[id number]]  Admitting Physician: Rolla Plate [[id number]]  Attending Physician: Rolla Plate [[id number]]  Estimated length of stay: past midnight tomorrow  Certification:: I certify this patient will need inpatient services for at least 2 midnights       B Medical/Surgery History Past Medical History:  Diagnosis Date  . Anxiety   . Arthritis   . Diabetes mellitus without complication ([place])   . DVT (deep venous thrombosis) ([place])   . Hypercholesteremia   . Hypertension   . Reflux   . Sleep apnea    Past Surgical History:  Procedure Laterality Date  . ANKLE SURGERY Left   . BUNIONECTOMY    . HEEL SPUR SURGERY    . LIPOMA EXCISION    . SHOULDER SURGERY    . TONSILLECTOMY       A IV Location/Drains/Wounds Patient Lines/Drains/Airways  Status    Active Line/Drains/Airways    Name Placement date Placement time Site Days   Peripheral IV 02/03/20 Right Hand 02/03/20  0115  Hand  less than 1          Intake/Output Last 24 hours  Intake/Output Summary (Last 24 hours) at 02/03/2020 1656 Last data filed at 02/03/2020 1636 Gross per 24 hour  Intake 444.84 ml  Output --  Net 444.84 ml    Labs/Imaging Results for orders placed or performed during the hospital encounter of 02/03/20 (from the past 48 hour(s))  CBG monitoring, ED     Status: Abnormal   Collection Time: 02/03/20 12:36 AM  Result Value Ref Range   Glucose-Capillary 284 (H) 70 - 99 mg/dL    Comment: Glucose reference range applies only to samples taken after fasting for at least 8 hours.  Lactic acid, plasma     Status: Abnormal   Collection Time: 02/03/20 12:48 AM  Result Value Ref Range   Lactic Acid, Venous 3.3 (HH) 0.5 - 1.9 mmol/L    Comment: CRITICAL RESULT CALLED TO, READ BACK BY AND VERIFIED WITH: MYRICK,B @ 0131 ON 02/03/20 BY JUW Performed at [facility], [address] [place], [place] [address]   Comprehensive metabolic panel     Status: Abnormal   Collection Time: 02/03/20 12:48 AM  Result Value Ref Range   Sodium 134 (L) 135 - 145 mmol/L   Potassium 3.7 3.5 -  5.1 mmol/L   Chloride 104 98 - 111 mmol/L   CO2 19 (L) 22 - 32 mmol/L   Glucose, Bld 293 (H) 70 - 99 mg/dL    Comment: Glucose reference range applies only to samples taken after fasting for at least 8 hours.   BUN 17 8 - 23 mg/dL   Creatinine, Ser 0.90 0.61 - 1.24 mg/dL   Calcium 8.5 (L) 8.9 - 10.3 mg/dL   Total Protein 6.6 6.5 - 8.1 g/dL   Albumin 3.7 3.5 - 5.0 g/dL   AST 16 15 - 41 U/L   ALT 18 0 - 44 U/L   Alkaline Phosphatase 95 38 - 126 U/L   Total Bilirubin 1.0 0.3 - 1.2 mg/dL   GFR calc non Af Amer >60 >60 mL/min   GFR calc Af Amer >60 >60 mL/min   Anion gap 11 5 - 15    Comment: Performed at [facility], [address] [place], [place] [address]  CBC WITH  DIFFERENTIAL     Status: Abnormal   Collection Time: 02/03/20 12:48 AM  Result Value Ref Range   WBC 10.6 (H) 4.0 - 10.5 K/uL   RBC 4.81 4.22 - 5.81 MIL/uL   Hemoglobin 14.1 13.0 - 17.0 g/dL   HCT 43.6 39 - 52 %   MCV 90.6 80.0 - 100.0 fL   MCH 29.3 26.0 - 34.0 pg   MCHC 32.3 30.0 - 36.0 g/dL   RDW 13.4 11.5 - 15.5 %   Platelets 161 150 - 400 K/uL   nRBC 0.0 0.0 - 0.2 %   Neutrophils Relative % 93 %   Neutro Abs 9.9 (H) 1.7 - 7.7 K/uL   Lymphocytes Relative 4 %   Lymphs Abs 0.4 (L) 0.7 - 4.0 K/uL   Monocytes Relative 2 %   Monocytes Absolute 0.2 0 - 1 K/uL   Eosinophils Relative 0 %   Eosinophils Absolute 0.0 0 - 0 K/uL   Basophils Relative 0 %   Basophils Absolute 0.0 0 - 0 K/uL   Immature Granulocytes 1 %   Abs Immature Granulocytes 0.05 0.00 - 0.07 K/uL    Comment: Performed at [facility], [address], [place], [place] [address]  APTT     Status: None   Collection Time: 02/03/20 12:48 AM  Result Value Ref Range   aPTT 26 24 - 36 seconds    Comment: Performed at [facility], [address], [place], [place] [address]  Protime-INR     Status: None   Collection Time: 02/03/20 12:48 AM  Result Value Ref Range   Prothrombin Time 13.5 11.4 - 15.2 seconds   INR 1.1 0.8 - 1.2    Comment: (NOTE) INR goal varies based on device and disease states. Performed at [facility], [address] [place], [place] [address]   Blood Culture (routine x 2)     Status: None (Preliminary result)   Collection Time: 02/03/20 12:48 AM   Specimen: Blood  Result Value Ref Range   Specimen Description BLOOD RIGHT HAND    Special Requests      BOTTLES DRAWN AEROBIC AND ANAEROBIC Blood Culture adequate volume   Culture      NO GROWTH < 12 HOURS Performed at [facility], [address] [place], [place] [address]    Report Status PENDING   Procalcitonin     Status: None   Collection Time: 02/03/20 12:48 AM  Result Value Ref Range   Procalcitonin 1.93 ng/mL  Comment:         Interpretation: PCT > 0.5 ng/mL and <= 2 ng/mL: Systemic infection (sepsis) is possible, but other conditions are known to elevate PCT as well. (NOTE)       Sepsis PCT Algorithm           Lower Respiratory Tract                                      Infection PCT Algorithm    ----------------------------     ----------------------------         PCT < 0.25 ng/mL                PCT < 0.10 ng/mL          Strongly encourage             Strongly discourage   discontinuation of antibiotics    initiation of antibiotics    ----------------------------     -----------------------------       PCT 0.25 - 0.50 ng/mL            PCT 0.10 - 0.25 ng/mL               OR       >80% decrease in PCT            Discourage initiation of                                            antibiotics      Encourage discontinuation           of antibiotics    ----------------------------     -----------------------------         PCT >= 0.50 ng/mL              PCT 0.26 - 0.50 ng/mL                AND       <80% decrease in PCT             Encourage initiation of                                             antibiotics       Encourage continuation           of antibiotics    ----------------------------     -----------------------------        PCT >= 0.50 ng/mL                  PCT > 0.50 ng/mL               AND         increase in PCT                  Strongly encourage                                      initiation of antibiotics    Strongly encourage escalation           of antibiotics                                     -----------------------------  PCT <= 0.25 ng/mL                                                 OR                                        > 80% decrease in PCT                                      Discontinue / Do not initiate                                             antibiotics  Performed at [facility], [address], [place], [place]  [address]   Blood Culture (routine x 2)     Status: None (Preliminary result)   Collection Time: 02/03/20 12:53 AM   Specimen: Blood  Result Value Ref Range   Specimen Description RIGHT ANTECUBITAL    Special Requests      BOTTLES DRAWN AEROBIC AND ANAEROBIC Blood Culture adequate volume   Culture      NO GROWTH < 12 HOURS Performed at [facility], [address] [place], [place] [address]    Report Status PENDING   Urinalysis, Routine w reflex microscopic     Status: Abnormal   Collection Time: 02/03/20  1:24 AM  Result Value Ref Range   Color, Urine YELLOW YELLOW   APPearance CLEAR CLEAR   Specific Gravity, Urine 1.031 (H) 1.005 - 1.030   pH 5.0 5.0 - 8.0   Glucose, UA >=500 (A) NEGATIVE mg/dL   Hgb urine dipstick MODERATE (A) NEGATIVE   Bilirubin Urine NEGATIVE NEGATIVE   Ketones, ur 5 (A) NEGATIVE mg/dL   Protein, ur NEGATIVE NEGATIVE mg/dL   Nitrite NEGATIVE NEGATIVE   Leukocytes,Ua NEGATIVE NEGATIVE   RBC / HPF >50 (H) 0 - 5 RBC/hpf   WBC, UA >50 (H) 0 - 5 WBC/hpf   Bacteria, UA RARE (A) NONE SEEN   Squamous Epithelial / LPF 0-5 0 - 5    Comment: Performed at [facility], [address], [place], [place] [address]  SARS Coronavirus 2 by RT PCR (hospital order, performed in [place] hospital lab) Nasopharyngeal Nasopharyngeal Swab     Status: None   Collection Time: 02/03/20  1:24 AM   Specimen: Nasopharyngeal Swab  Result Value Ref Range   SARS Coronavirus 2 NEGATIVE NEGATIVE    Comment: (NOTE) SARS-CoV-2 target nucleic acids are NOT DETECTED.  The SARS-CoV-2 RNA is generally detectable in upper and lower respiratory specimens during the acute phase of infection. The lowest concentration of SARS-CoV-2 viral copies this assay can detect is 250 copies / mL. A negative result does not preclude SARS-CoV-2 infection and should not be used as the sole basis for treatment or other patient management decisions.  A negative result may occur with improper specimen  collection / handling, submission of specimen other than nasopharyngeal swab, presence of viral mutation(s) within the areas targeted by this assay, and inadequate number of viral copies (<250 copies / mL). A  negative result must be combined with clinical observations, patient history, and epidemiological information.  Fact Sheet for Patients:   StrictlyIdeas.no  Fact Sheet for Healthcare Providers: BankingDealers.co.za  This test is not yet approved or  cleared by the [place] FDA and has been authorized for detection and/or diagnosis of SARS-CoV-2 by FDA under an Emergency Use Authorization (EUA).  This EUA will remain in effect (meaning this test can be used) for the duration of the COVID-19 declaration under Section 564(b)(1) of the Act, 21 U.S.C. section 360bbb-3(b)(1), unless the authorization is terminated or revoked sooner.  Performed at [facility], [address], [place], [place] [address]   Lactic acid, plasma     Status: None   Collection Time: 02/03/20  2:38 AM  Result Value Ref Range   Lactic Acid, Venous 1.5 0.5 - 1.9 mmol/L    Comment: Performed at [facility], [address] [place], [place] [address]  HIV Antibody (routine testing w rflx)     Status: None   Collection Time: 02/03/20  8:33 AM  Result Value Ref Range   HIV Screen 4th Generation wRfx Non Reactive Non Reactive    Comment: Performed at [facility], [place] [address] [place], [place] [address]  Magnesium     Status: None   Collection Time: 02/03/20  8:33 AM  Result Value Ref Range   Magnesium 1.9 1.7 - 2.4 mg/dL    Comment: Performed at [facility], [address] [place], [place] [address]  Phosphorus     Status: None   Collection Time: 02/03/20  8:33 AM  Result Value Ref Range   Phosphorus 4.5 2.5 - 4.6 mg/dL    Comment: Performed at [facility], [address], [place], [place] [address]  Hemoglobin A1c     Status: Abnormal    Collection Time: 02/03/20  8:33 AM  Result Value Ref Range   Hgb A1c MFr Bld 7.9 (H) 4.8 - 5.6 %    Comment: (NOTE) Pre diabetes:          5.7%-6.4%  Diabetes:              >6.4%  Glycemic control for   <7.0% adults with diabetes    Mean Plasma Glucose 180.03 mg/dL    Comment: Performed at [facility] [address], [place], [place] [address]  TSH     Status: None   Collection Time: 02/03/20  8:33 AM  Result Value Ref Range   TSH 0.826 0.350 - 4.500 uIU/mL    Comment: Performed by a 3rd Generation assay with a functional sensitivity of <=0.01 uIU/mL. Performed at [facility], [address] [place], [place] [address]   CBG monitoring, ED     Status: Abnormal   Collection Time: 02/03/20  9:21 AM  Result Value Ref Range   Glucose-Capillary 150 (H) 70 - 99 mg/dL    Comment: Glucose reference range applies only to samples taken after fasting for at least 8 hours.  CBG monitoring, ED     Status: Abnormal   Collection Time: 02/03/20  1:30 PM  Result Value Ref Range   Glucose-Capillary 148 (H) 70 - 99 mg/dL    Comment: Glucose reference range applies only to samples taken after fasting for at least 8 hours.   CT ABDOMEN PELVIS W CONTRAST  Result Date: 02/03/2020 CLINICAL DATA:  Fever and hyperglycemia EXAM: CT ABDOMEN AND PELVIS WITH CONTRAST TECHNIQUE: Multidetector CT imaging of the abdomen and pelvis was performed  using the standard protocol following bolus administration of intravenous contrast. CONTRAST:  128mL OMNIPAQUE IOHEXOL 300 MG/ML  SOLN COMPARISON:  None. FINDINGS: LOWER CHEST: Normal. HEPATOBILIARY: Unchanged subcapsular hemangioma of the right hepatic lobe. Liver otherwise unremarkable. Normal gallbladder PANCREAS: Normal pancreas. No ductal dilatation or peripancreatic fluid collection. SPLEEN: Normal. ADRENALS/URINARY TRACT: The adrenal glands are normal. No hydronephrosis, nephroureterolithiasis or solid renal mass. The urinary bladder is normal for degree of  distention STOMACH/BOWEL: There is no hiatal hernia. Normal duodenal course and caliber. No small bowel dilatation or inflammation. No focal colonic abnormality. Normal appendix. VASCULAR/LYMPHATIC: There is calcific atherosclerosis of the abdominal aorta. No lymphadenopathy. REPRODUCTIVE: Enlarged prostate measures 6 cm in transverse dimension. MUSCULOSKELETAL. No bony spinal canal stenosis or focal osseous abnormality. OTHER: None. IMPRESSION: 1. No acute abnormality of the abdomen or pelvis. 2. Unchanged subcapsular hemangioma of the right hepatic lobe. 3. Aortic Atherosclerosis (ICD10-I70.0). Electronically Signed   By: Ulyses Jarred M.D.   On: 02/03/2020 05:33   DG Chest Port 1 View  Result Date: 02/03/2020 CLINICAL DATA:  Fever EXAM: PORTABLE CHEST 1 VIEW COMPARISON:  09/03/2014 FINDINGS: The heart size and mediastinal contours are within normal limits. Both lungs are clear. The visualized skeletal structures are unremarkable. IMPRESSION: No active disease. Electronically Signed   By: Ulyses Jarred M.D.   On: 02/03/2020 01:17    Pending Labs Unresulted Labs (From admission, onward) Comment          Start     Ordered   02/10/20 0500  Creatinine, serum  (enoxaparin (LOVENOX)    CrCl >/= 30 ml/min)  Weekly,   R     Comments: while on enoxaparin therapy    02/03/20 0809   02/04/20 3762  Basic metabolic panel  Tomorrow morning,   R        02/03/20 0809   02/04/20 0500  CBC  Tomorrow morning,   R        02/03/20 0809   02/04/20 0500  Lipid panel  Tomorrow morning,   R        02/03/20 0819   02/03/20 0046  Urine culture  ONCE - STAT,   STAT        02/03/20 0046          Vitals/Pain Today's Vitals   02/03/20 1400 02/03/20 1430 02/03/20 1600 02/03/20 1636  BP: (!) 98/46 (!) 99/46 (!) 115/63   Pulse:      Resp: (!) 26 23 22    Temp:    99.4 F (37.4 C)  TempSrc:    Oral  SpO2:      Weight:      Height:      PainSc:        Isolation Precautions No active  isolations  Medications Medications  ceFEPIme (MAXIPIME) 2 g in sodium chloride 0.9 % 100 mL IVPB (0 g Intravenous Stopped 02/03/20 1636)  vancomycin (VANCOREADY) IVPB 1250 mg/250 mL (0 mg Intravenous Stopped 02/03/20 1552)  aspirin EC tablet 81 mg (81 mg Oral Given 02/03/20 1048)  doxazosin (CARDURA) tablet 2 mg (2 mg Oral Not Given 02/03/20 1601)  fluticasone (FLONASE) 50 MCG/ACT nasal spray 1 spray (1 spray Each Nare Not Given 02/03/20 1600)  ascorbic acid (VITAMIN C) tablet 500 mg (500 mg Oral Given 02/03/20 1047)  magnesium gluconate (MAGONATE) tablet 1,000 mg (1,000 mg Oral Not Given 02/03/20 1248)  0.9 %  sodium chloride infusion ( Intravenous Rate/Dose Verify 02/03/20 1636)  ondansetron (ZOFRAN) tablet 4 mg (has no administration  in time range)    Or  ondansetron (ZOFRAN) injection 4 mg (has no administration in time range)  acetaminophen (TYLENOL) tablet 650 mg (650 mg Oral Given 02/03/20 1228)    Or  acetaminophen (TYLENOL) suppository 650 mg ( Rectal See Alternative 02/03/20 1228)  enoxaparin (LOVENOX) injection 55 mg (55 mg Subcutaneous Given 02/03/20 1048)  insulin aspart (novoLOG) injection 0-15 Units (2 Units Subcutaneous Not Given 02/03/20 1335)  insulin aspart (novoLOG) injection 0-5 Units (has no administration in time range)  rosuvastatin (CRESTOR) tablet 10 mg (has no administration in time range)  ceFEPIme (MAXIPIME) 2 g in sodium chloride 0.9 % 100 mL IVPB (0 g Intravenous Stopped 02/03/20 0326)  metroNIDAZOLE (FLAGYL) IVPB 500 mg (0 mg Intravenous Stopped 02/03/20 0326)  lactated ringers bolus 1,000 mL (0 mLs Intravenous Stopped 02/03/20 0326)  acetaminophen (TYLENOL) tablet 650 mg (650 mg Oral Given 02/03/20 0117)  vancomycin (VANCOREADY) IVPB 2000 mg/400 mL (0 mg Intravenous Stopped 02/03/20 0544)  iohexol (OMNIPAQUE) 300 MG/ML solution 100 mL (100 mLs Intravenous Contrast Given 02/03/20 0456)    Mobility walks Low fall risk   Focused Assessments    R Recommendations:  See Admitting Provider Note  Report given to:   Additional Notes:

## 2020-02-03 NOTE — H&P (Signed)
History and Physical    [name]:[id number] DOB: 05-12-1956 DOA: 02/03/2020  PCP: [name], MD   Patient coming from: Home  I have personally briefly reviewed patient's old medical records in [place]  Chief Complaint: Fever, not feeling well.  HPI: [name] is a 64 y.o. male with medical history significant of type 2 diabetes, hypertension, gastroesophageal reflux disease, class I obesity, BPH and hyperlipidemia; who presented to the hospital secondary to general malaise and fever. Patient initial main concern was incorrectly taking too much hypoglycemic medications after having trouble cutting his pills. Subsequent concern of development of hypoglycemia. EMS was contacted. On arrival he was found to be febrile with a blood sugar in the 280 range, tachycardic and very tachypneic. Patient has a low normal oxygen saturation in the 90% and was placed on 2 L nasal cannula by EMS. Patient has been transported to the hospital for further evaluation and management. Patient reported having some intermittent dysuria and recently seen his urologist for prostate biopsy.   Patient expressed no having chest pain, no nausea, no vomiting, no abdominal pain, no headaches, no hematuria, no melena, no hematochezia, no focal weakness or any other complaints.  ED Course: Patient met sepsis criteria with high-grade temperature, tachycardia, tachypnea, mild elevation of WBCs, lactic acid of 3.3 presumed source of infection from his urinary tract. Cultures taken, fluid resuscitation per sepsis protocol initiated and broad-spectrum antibiotics started. [facility] has been contacted to admit patient for further evaluation and management. Covid test negative.  Review of Systems: As per HPI otherwise all other systems reviewed and are negative.   Past Medical History:  Diagnosis Date  . Anxiety   . Arthritis   . Diabetes mellitus without complication ([place])   . DVT (deep venous thrombosis) ([place])   .  Hypercholesteremia   . Hypertension   . Reflux   . Sleep apnea     Past Surgical History:  Procedure Laterality Date  . ANKLE SURGERY Left   . BUNIONECTOMY    . HEEL SPUR SURGERY    . LIPOMA EXCISION    . SHOULDER SURGERY    . TONSILLECTOMY      Social History  reports that he has never smoked. He has never used smokeless tobacco. He reports that he does not drink alcohol and does not use drugs.  Allergies  Allergen Reactions  . Vicodin [Hydrocodone-Acetaminophen] Rash and Other (See Comments)    Sweating, Unconsciousness, fainting   . Niacin Other (See Comments)    Warning- Niacin red skin and mild sweating  . Niacin And Related   . Rabeprazole     Family History  Problem Relation Age of Onset  . Healthy Mother   . Parkinson's disease Father      Prior to Admission medications   Medication Sig Start Date End Date Taking? Authorizing Provider  amLODipine (NORVASC) 10 MG tablet Take 10 mg by mouth daily. Patient not taking: Reported on 01/08/2020    [provider]  aspirin EC 81 MG tablet Take 81 mg by mouth daily.    [provider]  Calcium Carb-Ergocalciferol 500-200 MG-UNIT TABS Take by mouth. Patient not taking: Reported on 01/08/2020    [provider]  clobetasol ointment (TEMOVATE) 8.11 % Apply 1 application topically 2 (two) times daily. Patient not taking: Reported on 01/08/2020    [provider]  clotrimazole-betamethasone (LOTRISONE) cream Apply 1 application topically 2 (two) times daily. 01/09/20   McKenzie, Candee Furbish, MD  doxazosin (CARDURA) 2 MG tablet Take 2 mg by mouth daily.    [provider]  Fenofibrate 150 MG CAPS Take by mouth. Patient not taking: Reported on 01/08/2020    [provider]  fish oil-omega-3 fatty acids 1000 MG capsule Take 1 g by mouth 2 (two) times daily.    [provider]  fluticasone (FLONASE) 50 MCG/ACT nasal spray Place into both nostrils daily.    [provider]  hydrochlorothiazide (HYDRODIURIL) 25 MG tablet Take 25 mg by mouth daily. Patient not taking: Reported on 01/08/2020    [provider]  insulin aspart (NOVOLOG FLEXPEN) 100 unit/mL SOLN FlexPen Inject 40 Units into the skin at bedtime. Patient not taking: Reported on 01/08/2020    [provider]  Insulin Glargine (LANTUS SOLOSTAR) 100 UNIT/ML SOPN Inject 60 Units into the skin at bedtime. Patient not taking: Reported on 01/08/2020    [provider]  JARDIANCE 25 MG TABS tablet Take by mouth. 11/30/19   [provider]  ketoconazole (NIZORAL) 2 % cream Apply 1 application topically daily. 12/21/19   [provider]  losartan (COZAAR) 100 MG tablet Take 100 mg by mouth daily.    [provider]  magnesium gluconate (MAGONATE) 500 MG tablet Take 1,000 mg by mouth every morning.    [provider]  magnesium oxide (MAG-OX) 400 MG tablet Take by mouth. Patient not taking: Reported on 01/08/2020    [provider]  metFORMIN (GLUCOPHAGE) 1000 MG tablet Take 1,000 mg by mouth 2 (two) times daily with a meal.    [provider]  Multiple Vitamins-Minerals (CENTRUM SILVER ADULT 50+ PO) Take 1 tablet by mouth every morning.    [provider]  omeprazole (PRILOSEC) 40 MG capsule Take 40 mg by mouth 2 (two) times daily. Patient not taking: Reported on 01/08/2020    [provider]  ranitidine (ZANTAC) 150 MG tablet Take by mouth. Patient not taking: Reported on 01/08/2020    [provider]  rosuvastatin (CRESTOR) 10 MG tablet Take 10 mg by mouth daily.    [provider]  simvastatin (ZOCOR) 10 MG tablet Take by mouth. Patient not taking: Reported on 01/08/2020    [provider]  traMADol (ULTRAM) 50 MG tablet Take 1 tablet (50 mg total) by mouth every 6 (six) hours as needed for pain. Patient not taking: Reported on 01/08/2020 11/28/12   Milton Ferguson, MD  vitamin C  (ASCORBIC ACID) 500 MG tablet Take 500 mg by mouth every morning.    [provider]    Physical Exam: Vitals:   02/03/20 0037 02/03/20 0041  BP:  (!) 135/59  Pulse:  (!) 113  Resp:  (!) 33  Temp:  (!) 102.7 F (39.3 C)  TempSrc:  Oral  SpO2:  94%  Weight: (!) 113.6 kg   Height: 6' 1.5" (1.867 m)     Constitutional: Denies chest pain, shortness of breath, nausea or vomiting currently. Expressed feeling much better after fluid resuscitation and antipyretics. Vitals:   02/03/20 0037 02/03/20 0041  BP:  (!) 135/59  Pulse:  (!) 113  Resp:  (!) 33  Temp:  (!) 102.7 F (39.3 C)  TempSrc:  Oral  SpO2:  94%  Weight: (!) 113.6 kg   Height: 6' 1.5" (1.867 m)    Eyes: PERRL, lids and conjunctivae normal, no icterus, no nystagmus. ENMT: Mucous membranes dry on examination. Posterior pharynx clear of any exudate or lesions. Neck: normal, supple, no  masses, no thyromegaly. Respiratory: Appreciated tachypnea on examination; no wheezing, no crackles, no rhonchi, no using accessory muscle. Expressed no feeling short of breath. Good oxygen saturation on room air.  Cardiovascular: Sinus tachycardia, no rubs, no gallops, no murmurs, no JVD.  Abdomen: no tenderness, no masses palpated. No hepatosplenomegaly. Bowel sounds positive.  Musculoskeletal: no clubbing / cyanosis. No joint deformity upper and lower extremities. Good ROM, no contractures. Normal muscle tone.  Skin: no rashes, lesions, ulcers. No induration Neurologic: CN 2-12 grossly intact. Sensation intact, DTR normal. Strength 5/5 in all 4.  Psychiatric: Normal judgment and insight. Alert and oriented x 3. Normal mood.    Labs on Admission: I have personally reviewed following labs and imaging studies  CBC: Recent Labs  Lab 02/03/20 0048  WBC 10.6*  NEUTROABS 9.9*  HGB 14.1  HCT 43.6  MCV 90.6  PLT 161    Basic Metabolic Panel: Recent Labs  Lab 02/03/20 0048  NA 134*  K 3.7  CL 104  CO2 19*  GLUCOSE 293*   BUN 17  CREATININE 0.90  CALCIUM 8.5*    GFR: Estimated Creatinine Clearance: 113.3 mL/min (by C-G formula based on SCr of 0.9 mg/dL).  Liver Function Tests: Recent Labs  Lab 02/03/20 0048  AST 16  ALT 18  ALKPHOS 95  BILITOT 1.0  PROT 6.6  ALBUMIN 3.7    Urine analysis:    Component Value Date/Time   COLORURINE YELLOW 02/03/2020 0124   APPEARANCEUR CLEAR 02/03/2020 0124   LABSPEC 1.031 (H) 02/03/2020 0124   PHURINE 5.0 02/03/2020 0124   GLUCOSEU >=500 (A) 02/03/2020 0124   HGBUR MODERATE (A) 02/03/2020 0124   BILIRUBINUR NEGATIVE 02/03/2020 0124   BILIRUBINUR neg 01/08/2020 1142   KETONESUR 5 (A) 02/03/2020 0124   PROTEINUR NEGATIVE 02/03/2020 0124   UROBILINOGEN 0.2 01/08/2020 1142   UROBILINOGEN 0.2 11/28/2012 1654   NITRITE NEGATIVE 02/03/2020 0124   LEUKOCYTESUR NEGATIVE 02/03/2020 0124    Radiological Exams on Admission: CT ABDOMEN PELVIS W CONTRAST  Result Date: 02/03/2020 CLINICAL DATA:  Fever and hyperglycemia EXAM: CT ABDOMEN AND PELVIS WITH CONTRAST TECHNIQUE: Multidetector CT imaging of the abdomen and pelvis was performed using the standard protocol following bolus administration of intravenous contrast. CONTRAST:  166m OMNIPAQUE IOHEXOL 300 MG/ML  SOLN COMPARISON:  None. FINDINGS: LOWER CHEST: Normal. HEPATOBILIARY: Unchanged subcapsular hemangioma of the right hepatic lobe. Liver otherwise unremarkable. Normal gallbladder PANCREAS: Normal pancreas. No ductal dilatation or peripancreatic fluid collection. SPLEEN: Normal. ADRENALS/URINARY TRACT: The adrenal glands are normal. No hydronephrosis, nephroureterolithiasis or solid renal mass. The urinary bladder is normal for degree of distention STOMACH/BOWEL: There is no hiatal hernia. Normal duodenal course and caliber. No small bowel dilatation or inflammation. No focal colonic abnormality. Normal appendix. VASCULAR/LYMPHATIC: There is calcific atherosclerosis of the abdominal aorta. No lymphadenopathy.  REPRODUCTIVE: Enlarged prostate measures 6 cm in transverse dimension. MUSCULOSKELETAL. No bony spinal canal stenosis or focal osseous abnormality. OTHER: None. IMPRESSION: 1. No acute abnormality of the abdomen or pelvis. 2. Unchanged subcapsular hemangioma of the right hepatic lobe. 3. Aortic Atherosclerosis (ICD10-I70.0). Electronically Signed   By: KUlyses JarredM.D.   On: 02/03/2020 05:33   DG Chest Port 1 View  Result Date: 02/03/2020 CLINICAL DATA:  Fever EXAM: PORTABLE CHEST 1 VIEW COMPARISON:  09/03/2014 FINDINGS: The heart size and mediastinal contours are within normal limits. Both lungs are clear. The visualized skeletal structures are unremarkable. IMPRESSION: No active disease. Electronically Signed   By: KUlyses JarredM.D.   On:  02/03/2020 01:17    EKG: None.  Assessment/Plan 1-sepsis [facility]): Patient met sepsis criteria on admission. -No organ dysfunction appreciated currently -So far good response to sepsis protocol initiation with reduction of lactic acid from 3.3-1.5 -Suspected source of infection is patient's urinary tract; continue broad-spectrum antibiotics using cefepime and vancomycin and follow cultures results. -Continue fluid resuscitation, as needed antiemetics and antipyretics. -Follow clinical response.  2-Class 1 obesity due to excess calories with body mass index (BMI) of 32.0 to 32.9 in adult -Low calorie diet, portion control and increase physical activity discussed with patient. -Body mass index is 32.6 kg/m.  3-HTN (hypertension) -Blood pressure stable available -Initially we will hold antihypertensive agents while actively resuscitated in an overcoming sepsis features. -Follow vital signs.  4-type 2 diabetes: Not on insulin therapy with mild hyperglycemia on presentation -Will check A1c -Statin sliding scale insulin and holding oral hypoglycemic agents while inpatient.  5-HLD (hyperlipidemia) -Will check lipid panel -Continue statins.  6-BPH  (benign prostatic hyperplasia) -No complaints of urinary retention symptoms currently -Continue Cardura.  DVT prophylaxis: Lovenox Code Status:   Full code Family Communication:  No family members at bedside. Disposition Plan:   Patient is from:  Home  Anticipated DC to:  Home  Anticipated DC date:  To be determined, based on clinical response.  Anticipated DC barriers: Resolution of sepsis features and ability to maintain adequate hydration/nutrition by mouth.  Consults called:  None Admission status:  Telemetry bed, inpatient status, length of stay more than 2 midnights.  Severity of Illness: Severe severity; patient presenting to the emergency department meeting sepsis criteria with presumed source of infection urinary tract, he had elevated temperature, heart rate, respiratory rate, mild elevation in his WBCs, lactic acid of 3.3 and a history of recent prostate biopsy. Patient reporting some dysuria and just not feeling well. Covid test negative. MEWS score on presentation 6. Admitted for IV antibiotics, IV fluid resuscitation, supportive care and close monitoring of culture results.    Barton Dubois MD [facility]  How to contact the [facility] Attending or Consulting provider [place] or covering provider during after hours [place], for this patient?   1. Check the care team in [facility] and look for a) attending/consulting [facility] provider listed and b) the [facility] team listed 2. Log into [email or web address] and use 's universal password to access. If you do not have the password, please contact the hospital operator. 3. Locate the [facility] provider you are looking for under [facility] and page to a number that you can be directly reached. 4. If you still have difficulty reaching the provider, please page the [facility] (Director on Call) for the Hospitalists listed on amion for assistance.  02/03/2020, 8:10 AM

## 2020-02-03 NOTE — ED Triage Notes (Signed)
Pt called EMS for diabetic issue. On arrival pt had fever of 103 oral, CBG of 283, and generalized weakness. Pt says that they were concerned that his glucose was low before calling EMS so wife gave him a candy bar and some orange juice.

## 2020-02-03 NOTE — ED Provider Notes (Signed)
[facility] Provider Note   CSN: [id number] Arrival date & time: 02/03/20  0030     History Chief Complaint  Patient presents with  . Fever    [name] is a 64 y.o. male.  Patient brought to the emergency department from home for generalized weakness.  Patient reports that he was recently put on a new oral diabetes medication.  He could not find his pill cutter this morning, cut the pill by hand.  He became concerned tonight that he may have cut the pill incorrectly and got too much medication.  He drank some juice and ate a candy bar, called EMS.  EMS report a blood sugar of 283.  They did, however, determined that the patient has a fever.  Patient noted to have decreased oxygen saturations by EMS, placed on O2 therapy.  He denies cough but does feel like his breathing is "labored".  Denies nausea, vomiting, diarrhea.  He has not had any abdominal pain.  Did notice some slight burning with urination earlier today, no flank pain.  He did recently have a prostate biopsy, denies rectal pain.        Past Medical History:  Diagnosis Date  . Anxiety   . Arthritis   . Diabetes mellitus without complication ([place])   . DVT (deep venous thrombosis) ([place])   . Hypercholesteremia   . Hypertension   . Reflux   . Sleep apnea     Patient Active Problem List   Diagnosis Date Noted  . Elevated PSA 01/08/2020  . Low back pain 07/12/2019  . Pain in right hip 07/12/2019  . Morbid (severe) obesity due to excess calories ([place]) 07/12/2019    Past Surgical History:  Procedure Laterality Date  . ANKLE SURGERY Left   . BUNIONECTOMY    . HEEL SPUR SURGERY    . LIPOMA EXCISION    . SHOULDER SURGERY    . TONSILLECTOMY         Family History  Problem Relation Age of Onset  . Healthy Mother   . Parkinson's disease Father     Social History   Tobacco Use  . Smoking status: Never Smoker  . Smokeless tobacco: Never Used  Substance Use Topics  . Alcohol use: No   . Drug use: No    Home Medications Prior to Admission medications   Medication Sig Start Date End Date Taking? Authorizing Provider  amLODipine (NORVASC) 10 MG tablet Take 10 mg by mouth daily. Patient not taking: Reported on 01/08/2020    [provider]  aspirin EC 81 MG tablet Take 81 mg by mouth daily.    [provider]  Calcium Carb-Ergocalciferol 500-200 MG-UNIT TABS Take by mouth. Patient not taking: Reported on 01/08/2020    [provider]  clobetasol ointment (TEMOVATE) 6.31 % Apply 1 application topically 2 (two) times daily. Patient not taking: Reported on 01/08/2020    [provider]  clotrimazole-betamethasone (LOTRISONE) cream Apply 1 application topically 2 (two) times daily. 01/09/20   McKenzie, Candee Furbish, MD  doxazosin (CARDURA) 2 MG tablet Take 2 mg by mouth daily.    [provider]  Fenofibrate 150 MG CAPS Take by mouth. Patient not taking: Reported on 01/08/2020    [provider]  fish oil-omega-3 fatty acids 1000 MG capsule Take 1 g by mouth 2 (two) times daily.    [provider]  fluticasone (FLONASE) 50 MCG/ACT nasal spray Place into both nostrils daily.  [provider]  hydrochlorothiazide (HYDRODIURIL) 25 MG tablet Take 25 mg by mouth daily. Patient not taking: Reported on 01/08/2020    [provider]  insulin aspart (NOVOLOG FLEXPEN) 100 unit/mL SOLN FlexPen Inject 40 Units into the skin at bedtime. Patient not taking: Reported on 01/08/2020    [provider]  Insulin Glargine (LANTUS SOLOSTAR) 100 UNIT/ML SOPN Inject 60 Units into the skin at bedtime. Patient not taking: Reported on 01/08/2020    [provider]  JARDIANCE 25 MG TABS tablet Take by mouth. 11/30/19   [provider]  ketoconazole (NIZORAL) 2 % cream Apply 1 application topically daily. 12/21/19   [provider]  losartan (COZAAR) 100 MG tablet Take 100 mg by mouth daily.     [provider]  magnesium gluconate (MAGONATE) 500 MG tablet Take 1,000 mg by mouth every morning.    [provider]  magnesium oxide (MAG-OX) 400 MG tablet Take by mouth. Patient not taking: Reported on 01/08/2020    [provider]  metFORMIN (GLUCOPHAGE) 1000 MG tablet Take 1,000 mg by mouth 2 (two) times daily with a meal.    [provider]  Multiple Vitamins-Minerals (CENTRUM SILVER ADULT 50+ PO) Take 1 tablet by mouth every morning.    [provider]  omeprazole (PRILOSEC) 40 MG capsule Take 40 mg by mouth 2 (two) times daily. Patient not taking: Reported on 01/08/2020    [provider]  ranitidine (ZANTAC) 150 MG tablet Take by mouth. Patient not taking: Reported on 01/08/2020    [provider]  rosuvastatin (CRESTOR) 10 MG tablet Take 10 mg by mouth daily.    [provider]  simvastatin (ZOCOR) 10 MG tablet Take by mouth. Patient not taking: Reported on 01/08/2020    [provider]  traMADol (ULTRAM) 50 MG tablet Take 1 tablet (50 mg total) by mouth every 6 (six) hours as needed for pain. Patient not taking: Reported on 01/08/2020 11/28/12   Milton Ferguson, MD  vitamin C (ASCORBIC ACID) 500 MG tablet Take 500 mg by mouth every morning.    [provider]    Allergies    Vicodin [hydrocodone-acetaminophen], Niacin, Niacin and related, and Rabeprazole  Review of Systems   Review of Systems  Constitutional: Positive for chills and fatigue.  Respiratory: Positive for shortness of breath.   Genitourinary: Positive for dysuria.  All other systems reviewed and are negative.   Physical Exam Updated Vital Signs BP (!) 135/59 (BP Location: Right Arm)   Pulse (!) 113   Temp (!) 102.7 F (39.3 C) (Oral)   Resp (!) 33   Ht 6' 1.5" (1.867 m)   Wt (!) 113.6 kg   SpO2 94%   BMI 32.60 kg/m   Physical Exam Vitals and nursing note reviewed.  Constitutional:      General: He is not in acute  distress.    Appearance: Normal appearance. He is well-developed.  HENT:     Head: Normocephalic and atraumatic.     Right Ear: Hearing normal.     Left Ear: Hearing normal.     Nose: Nose normal.  Eyes:     Conjunctiva/sclera: Conjunctivae normal.     Pupils: Pupils are equal, round, and reactive to light.  Cardiovascular:     Rate and Rhythm: Regular rhythm. Tachycardia present.     Heart sounds: S1 normal and S2 normal. No murmur heard.  No friction rub. No gallop.   Pulmonary:     Effort:  Pulmonary effort is normal. No respiratory distress.     Breath sounds: Normal breath sounds.  Chest:     Chest wall: No tenderness.  Abdominal:     General: Bowel sounds are normal.     Palpations: Abdomen is soft.     Tenderness: There is no abdominal tenderness. There is no guarding or rebound. Negative signs include Murphy's sign and McBurney's sign.     Hernia: No hernia is present.  Musculoskeletal:        General: Normal range of motion.     Cervical back: Normal range of motion and neck supple.  Skin:    General: Skin is warm and dry.     Findings: No rash.  Neurological:     Mental Status: He is alert and oriented to person, place, and time.     GCS: GCS eye subscore is 4. GCS verbal subscore is 5. GCS motor subscore is 6.     Cranial Nerves: No cranial nerve deficit.     Sensory: No sensory deficit.     Coordination: Coordination normal.  Psychiatric:        Speech: Speech normal.        Behavior: Behavior normal.        Thought Content: Thought content normal.     ED Results / Procedures / Treatments   Labs (all labs ordered are listed, but only abnormal results are displayed) Labs Reviewed  LACTIC ACID, PLASMA - Abnormal; Notable for the following components:      Result Value   Lactic Acid, Venous 3.3 (*)    All other components within normal limits  COMPREHENSIVE METABOLIC PANEL - Abnormal; Notable for the following components:   Sodium 134 (*)    CO2 19 (*)     Glucose, Bld 293 (*)    Calcium 8.5 (*)    All other components within normal limits  CBC WITH DIFFERENTIAL/PLATELET - Abnormal; Notable for the following components:   WBC 10.6 (*)    Neutro Abs 9.9 (*)    Lymphs Abs 0.4 (*)    All other components within normal limits  URINALYSIS, ROUTINE W REFLEX MICROSCOPIC - Abnormal; Notable for the following components:   Specific Gravity, Urine 1.031 (*)    Glucose, UA >=500 (*)    Hgb urine dipstick MODERATE (*)    Ketones, ur 5 (*)    RBC / HPF >50 (*)    WBC, UA >50 (*)    Bacteria, UA RARE (*)    All other components within normal limits  CBG MONITORING, ED - Abnormal; Notable for the following components:   Glucose-Capillary 284 (*)    All other components within normal limits  CULTURE, BLOOD (ROUTINE X 2)  CULTURE, BLOOD (ROUTINE X 2)  SARS CORONAVIRUS 2 BY RT PCR (HOSPITAL ORDER, [facility])  URINE CULTURE  LACTIC ACID, PLASMA  APTT  PROTIME-INR  PROCALCITONIN    EKG EKG Interpretation  Date/Time:  Saturday February 03 2020 00:41:28 EDT Ventricular Rate:  113 PR Interval:    QRS Duration: 93 QT Interval:  300 QTC Calculation: 412 R Axis:   80 Text Interpretation: Sinus tachycardia Abnormal R-wave progression, early transition Minimal ST depression, inferior leads No previous tracing Confirmed by Orpah Greek [phone]) on 02/03/2020 12:55:44 AM   Radiology CT ABDOMEN PELVIS W CONTRAST  Result Date: 02/03/2020 CLINICAL DATA:  Fever and hyperglycemia EXAM: CT ABDOMEN AND PELVIS WITH CONTRAST TECHNIQUE: Multidetector CT imaging of the abdomen  and pelvis was performed using the standard protocol following bolus administration of intravenous contrast. CONTRAST:  113mL OMNIPAQUE IOHEXOL 300 MG/ML  SOLN COMPARISON:  None. FINDINGS: LOWER CHEST: Normal. HEPATOBILIARY: Unchanged subcapsular hemangioma of the right hepatic lobe. Liver otherwise unremarkable. Normal gallbladder PANCREAS: Normal pancreas.  No ductal dilatation or peripancreatic fluid collection. SPLEEN: Normal. ADRENALS/URINARY TRACT: The adrenal glands are normal. No hydronephrosis, nephroureterolithiasis or solid renal mass. The urinary bladder is normal for degree of distention STOMACH/BOWEL: There is no hiatal hernia. Normal duodenal course and caliber. No small bowel dilatation or inflammation. No focal colonic abnormality. Normal appendix. VASCULAR/LYMPHATIC: There is calcific atherosclerosis of the abdominal aorta. No lymphadenopathy. REPRODUCTIVE: Enlarged prostate measures 6 cm in transverse dimension. MUSCULOSKELETAL. No bony spinal canal stenosis or focal osseous abnormality. OTHER: None. IMPRESSION: 1. No acute abnormality of the abdomen or pelvis. 2. Unchanged subcapsular hemangioma of the right hepatic lobe. 3. Aortic Atherosclerosis (ICD10-I70.0). Electronically Signed   By: Ulyses Jarred M.D.   On: 02/03/2020 05:33   DG Chest Port 1 View  Result Date: 02/03/2020 CLINICAL DATA:  Fever EXAM: PORTABLE CHEST 1 VIEW COMPARISON:  09/03/2014 FINDINGS: The heart size and mediastinal contours are within normal limits. Both lungs are clear. The visualized skeletal structures are unremarkable. IMPRESSION: No active disease. Electronically Signed   By: Ulyses Jarred M.D.   On: 02/03/2020 01:17    Procedures Procedures (including critical care time)  Medications Ordered in ED Medications  ceFEPIme (MAXIPIME) 2 g in sodium chloride 0.9 % 100 mL IVPB (has no administration in time range)  vancomycin (VANCOREADY) IVPB 1250 mg/250 mL (has no administration in time range)  ceFEPIme (MAXIPIME) 2 g in sodium chloride 0.9 % 100 mL IVPB (0 g Intravenous Stopped 02/03/20 0326)  metroNIDAZOLE (FLAGYL) IVPB 500 mg (0 mg Intravenous Stopped 02/03/20 0326)  lactated ringers bolus 1,000 mL (0 mLs Intravenous Stopped 02/03/20 0326)  acetaminophen (TYLENOL) tablet 650 mg (650 mg Oral Given 02/03/20 0117)  vancomycin (VANCOREADY) IVPB 2000 mg/400 mL  (2,000 mg Intravenous New Bag/Given 02/03/20 0201)  iohexol (OMNIPAQUE) 300 MG/ML solution 100 mL (100 mLs Intravenous Contrast Given 02/03/20 0456)    ED Course  I have reviewed the triage vital signs and the nursing notes.  Pertinent labs & imaging results that were available during my care of the patient were reviewed by me and considered in my medical decision making (see chart for details).    MDM Rules/Calculators/A&P                          Patient initially called EMS because he thought he might be experiencing hypoglycemia.  Blood sugar was not low when he was found to be febrile.  Patient is found to be septic at arrival to the ER.  Suspect urinary etiology.  Patient does report that he recently had a prostate biopsy.  He is not having any rectal or low back pain to suggest prostatitis.  CT scan does not show any abscess formation or other intra-abdominal or pelvic pathology.  Patient initially had a lactic acidosis of 3.3.  This has cleared with IV fluids.  He was started on broad-spectrum antibiotics at arrival.  No evidence of septic shock.  Final Clinical Impression(s) / ED Diagnoses Final diagnoses:  Sepsis without acute organ dysfunction, due to unspecified organism [facility])    Rx / DC Orders ED Discharge Orders    None       Shaquia Berkley, Gwenyth Allegra, MD 02/03/20  0541  

## 2020-02-04 LAB — CBC
HCT: 43.6 % (ref 39.0–52.0)
Hemoglobin: 13.7 g/dL (ref 13.0–17.0)
MCH: 29.6 pg (ref 26.0–34.0)
MCHC: 31.4 g/dL (ref 30.0–36.0)
MCV: 94.2 fL (ref 80.0–100.0)
Platelets: 142 10*3/uL — ABNORMAL LOW (ref 150–400)
RBC: 4.63 MIL/uL (ref 4.22–5.81)
RDW: 13.9 % (ref 11.5–15.5)
WBC: 13.1 10*3/uL — ABNORMAL HIGH (ref 4.0–10.5)
nRBC: 0 % (ref 0.0–0.2)

## 2020-02-04 LAB — LIPID PANEL
Cholesterol: 145 mg/dL (ref 0–200)
HDL: 28 mg/dL — ABNORMAL LOW (ref >40)
LDL Cholesterol: 91 mg/dL (ref 0–99)
Total CHOL/HDL Ratio: 5.2 RATIO
Triglycerides: 128 mg/dL (ref <150)
VLDL: 26 mg/dL (ref 0–40)

## 2020-02-04 LAB — BASIC METABOLIC PANEL
Anion gap: 10 (ref 5–15)
BUN: 13 mg/dL (ref 8–23)
CO2: 21 mmol/L — ABNORMAL LOW (ref 22–32)
Calcium: 8.2 mg/dL — ABNORMAL LOW (ref 8.9–10.3)
Chloride: 106 mmol/L (ref 98–111)
Creatinine, Ser: 0.7 mg/dL (ref 0.61–1.24)
GFR calc Af Amer: >60 mL/min (ref >60)
GFR calc non Af Amer: >60 mL/min (ref >60)
Glucose, Bld: 125 mg/dL — ABNORMAL HIGH (ref 70–99)
Potassium: 3.9 mmol/L (ref 3.5–5.1)
Sodium: 137 mmol/L (ref 135–145)

## 2020-02-04 LAB — BLOOD CULTURE ID PANEL (REFLEXED)

## 2020-02-04 LAB — GLUCOSE, CAPILLARY
Glucose-Capillary: 134 mg/dL — ABNORMAL HIGH (ref 70–99)
Glucose-Capillary: 176 mg/dL — ABNORMAL HIGH (ref 70–99)
Glucose-Capillary: 171 mg/dL — ABNORMAL HIGH (ref 70–99)
Glucose-Capillary: 195 mg/dL — ABNORMAL HIGH (ref 70–99)

## 2020-02-04 MED ORDER — SODIUM CHLORIDE 0.9 % IV BOLUS
500.0000 mL | Freq: Once | INTRAVENOUS | Status: AC
  Administered 2020-02-04: 500 mL via INTRAVENOUS

## 2020-02-04 MED ORDER — ALUM & MAG HYDROXIDE-SIMETH 200-200-20 MG/5ML PO SUSP
30.0000 mL | ORAL | Status: DC | PRN
  Administered 2020-02-04: 30 mL via ORAL
  Filled 2020-02-04: qty 30

## 2020-02-04 MED ORDER — SODIUM CHLORIDE 0.9 % IV SOLN
1.0000 g | Freq: Three times a day (TID) | INTRAVENOUS | Status: DC
  Administered 2020-02-04 – 2020-02-05 (×4): 1 g via INTRAVENOUS
  Filled 2020-02-04 (×4): qty 1

## 2020-02-04 NOTE — Progress Notes (Signed)
Pharmacy Antibiotic Note  [name] is a 64 y.o. male admitted on 02/03/2020 with bacteremia and UTI.  RN notified of gram neg rods in blood culture. Pharmacy has been consulted for meropenem dosing.   Plan: Meropenem 1000 mg IV every 8 hours. Monitor labs, c/s, and patient improvement.  Height: 6' 1.5" (186.7 cm) Weight: (!) 113.6 kg (250 lb 8 oz) IBW/kg (Calculated) : 81.05  Temp (24hrs), Avg:100 F (37.8 C), Min:97.7 F (36.5 C), Max:102.5 F (39.2 C)  Recent Labs  Lab 02/03/20 0048 02/03/20 0238 02/04/20 0631  WBC 10.6*  --  13.1*  CREATININE 0.90  --  0.70  LATICACIDVEN 3.3* 1.5  --     Estimated Creatinine Clearance: 127.4 mL/min (by C-G formula based on SCr of 0.7 mg/dL).    Allergies  Allergen Reactions  . Vicodin [Hydrocodone-Acetaminophen] Rash and Other (See Comments)    Sweating, Unconsciousness, fainting   . Niacin Other (See Comments)    Warning- Niacin red skin and mild sweating  . Niacin And Related   . Rabeprazole     Antimicrobials this admission: Vanco 7/24 >>7/25 Cefepime 7/24 >>7/25 Merrem 7/25 >>  Microbiology results: 7/24 Bcx: gram neg rods  7/24 Ucx: pending Thank you for allowing pharmacy to be a part of this patient's care.  Ramond Craver 02/04/2020 10:10 AM

## 2020-02-04 NOTE — Progress Notes (Addendum)
CRITICAL VALUE ALERT  Critical Value:  Anaerobic culture positive. Gram negative rods.   Date & Time Notied: 02/04/2020, 0852  Provider Notified: Dr. Dyann Kief Actions taken: Merrem, Vancomycin per pharmacy consult.

## 2020-02-04 NOTE — Progress Notes (Signed)
PROGRESS NOTE    [name]  RCV:[id number] DOB: [birth year] DOA: 02/03/2020 PCP: [name], MD   Chief Complaint  Patient presents with  . Fever    Brief Narrative:  [name] is a 64 y.o. male with medical history significant of type 2 diabetes, hypertension, gastroesophageal reflux disease, class I obesity, BPH and hyperlipidemia; who presented to the hospital secondary to general malaise and fever. Patient initial main concern was incorrectly taking too much hypoglycemic medications after having trouble cutting his pills. Subsequent concern of development of hypoglycemia. EMS was contacted. On arrival he was found to be febrile with a blood sugar in the 280 range, tachycardic and very tachypneic. Patient has a low normal oxygen saturation in the 90% and was placed on 2 L nasal cannula by EMS. Patient has been transported to the hospital for further evaluation and management. Patient reported having some intermittent dysuria and recently seen his urologist for prostate biopsy.   Patient expressed no having chest pain, no nausea, no vomiting, no abdominal pain, no headaches, no hematuria, no melena, no hematochezia, no focal weakness or any other complaints.  ED Course: Patient met sepsis criteria with high-grade temperature, tachycardia, tachypnea, mild elevation of WBCs, lactic acid of 3.3 presumed source of infection from his urinary tract. Cultures taken, fluid resuscitation per sepsis protocol initiated and broad-spectrum antibiotics started. [facility] has been contacted to admit patient for further evaluation and management. Covid test negative.    Assessment & Plan: 1-sepsis secondary to E. coli UTI/bacteremia: Present on admission. -Continue supportive care and as needed antipyretics -Antibiotic has been adjusted based on culture result; cefepime and vancomycin discontinued and patient started on meropenem. -Continue gentle fluid resuscitation and advance diet -Follow clinical  response.  2-class I obesity due to excess calories  -Body mass index is 32.6 kg/m. -Low calorie diet, portion control increase physical activity recommended.  3-essential hypertension -Blood pressure stable and well-controlled -Continue to follow vital signs while holding antihypertensive agents at this point. -Healthy diet recommended.  4-type 2 diabetes not on insulin therapy -A1c 7.9 -Continue sliding scale insulin modified carbohydrate diet.  5-lipidemia -Continue statins.  6-BPH -Continue Cardura -Patient reports no urinary retention symptoms at this time.   DVT prophylaxis: Lovenox Code Status: Full code Family Communication: No family at bedside. Disposition:   Status is: Inpatient  Dispo: The patient is from: Home              Anticipated d/c is to: Home              Anticipated d/c date is: To be determined              Patient currently no medically stable for discharge, still spiking high-grade temperatures and now with a positive blood culture.  Continue IV antibiotics, continue antipyretics, gentle hydration and supportive care.    Consultants:   None  Procedures:  See below for x-ray reports  Antimicrobials:  Vancomycin and cefepime 02/03/2020>> 02/04/2020 -Meropenem and 02/04/20  Subjective: Continues spiking high-grade temperature; no chest pain, no nausea, no vomiting.  No hematuria or any other acute complaint.  Reports no nausea or vomiting.  Objective: Vitals:   02/03/20 1934 02/04/20 0021 02/04/20 0149 02/04/20 0504  BP: (!) 134/69 (!) 120/55  (!) 107/49  Pulse: 94 88  68  Resp: '16 16  16  '$ Temp: (!) 100.7 F (38.2 C) (!) 102.5 F (39.2 C) 98.4 F (36.9 C) 97.7 F (36.5 C)  TempSrc: Oral Oral Oral  Oral  SpO2: 96% 100%  94%  Weight:      Height:        Intake/Output Summary (Last 24 hours) at 02/04/2020 1429 Last data filed at 02/04/2020 0511 Gross per 24 hour  Intake 444.84 ml  Output 600 ml  Net -155.16 ml   Filed Weights    02/03/20 0037  Weight: (!) 113.6 kg    Examination:  General exam: Appears calm and comfortable, reports no chest pain, no nausea, no vomiting.  Continued to spike high-grade fever. Respiratory system: Clear to auscultation. Respiratory effort normal. Cardiovascular system: S1 & S2 heard, RRR. No JVD, murmurs, rubs, gallops or clicks. No pedal edema. Gastrointestinal system: Abdomen is nondistended, soft and nontender. No organomegaly or masses felt. Normal bowel sounds heard. Central nervous system: Alert and oriented. No focal neurological deficits. Extremities: No cyanosis or clubbing. Skin: No rashes, lesions or ulcers; no petechiae. Psychiatry: Judgement and insight appear normal. Mood & affect appropriate.     Data Reviewed: I have personally reviewed following labs and imaging studies  CBC: Recent Labs  Lab 02/03/20 0048 02/04/20 0631  WBC 10.6* 13.1*  NEUTROABS 9.9*  --   HGB 14.1 13.7  HCT 43.6 43.6  MCV 90.6 94.2  PLT 161 142*    Basic Metabolic Panel: Recent Labs  Lab 02/03/20 0048 02/03/20 0833 02/04/20 0631  NA 134*  --  137  K 3.7  --  3.9  CL 104  --  106  CO2 19*  --  21*  GLUCOSE 293*  --  125*  BUN 17  --  13  CREATININE 0.90  --  0.70  CALCIUM 8.5*  --  8.2*  MG  --  1.9  --   PHOS  --  4.5  --     GFR: Estimated Creatinine Clearance: 127.4 mL/min (by C-G formula based on SCr of 0.7 mg/dL).  Liver Function Tests: Recent Labs  Lab 02/03/20 0048  AST 16  ALT 18  ALKPHOS 95  BILITOT 1.0  PROT 6.6  ALBUMIN 3.7    CBG: Recent Labs  Lab 02/03/20 1330 02/03/20 1859 02/03/20 2126 02/04/20 0829 02/04/20 1131  GLUCAP 148* 134* 112* 134* 176*     Recent Results (from the past 240 hour(s))  Blood Culture (routine x 2)     Status: None (Preliminary result)   Collection Time: 02/03/20 12:48 AM   Specimen: BLOOD RIGHT HAND  Result Value Ref Range Status   Specimen Description   Final    BLOOD RIGHT HAND Performed at [facility], [address] [place], [place] [address]    Special Requests   Final    BOTTLES DRAWN AEROBIC AND ANAEROBIC Blood Culture adequate volume Performed at [facility], [address], [place], [place] [address]    Culture  Setup Time   Final    GRAM NEGATIVE RODS ANAEROBIC BOTTLE ONLY CRITICAL RESULT CALLED TO, READ BACK BY AND VERIFIED WITH: Cloyde Reams [id number] AT 1315 BY CM Performed at [facility], [place] [address], [place],  [address]    Culture GRAM NEGATIVE RODS  Final   Report Status PENDING  Incomplete  Blood Culture ID Panel (Reflexed)     Status: Abnormal   Collection Time: 02/03/20 12:48 AM  Result Value Ref Range Status   Enterococcus species NOT DETECTED NOT DETECTED Final   Listeria monocytogenes NOT DETECTED NOT DETECTED Final   Staphylococcus species NOT DETECTED NOT DETECTED Final   Staphylococcus aureus (  BCID) NOT DETECTED NOT DETECTED Final   Streptococcus species NOT DETECTED NOT DETECTED Final   Streptococcus agalactiae NOT DETECTED NOT DETECTED Final   Streptococcus pneumoniae NOT DETECTED NOT DETECTED Final   Streptococcus pyogenes NOT DETECTED NOT DETECTED Final   Acinetobacter baumannii NOT DETECTED NOT DETECTED Final   Enterobacteriaceae species DETECTED (A) NOT DETECTED Final    Comment: Enterobacteriaceae represent a large family of gram-negative bacteria, not a single organism. CRITICAL RESULT CALLED TO, READ BACK BY AND VERIFIED WITH: PHARMD S HURTH [id number] SE 8315 BY CM    Enterobacter cloacae complex NOT DETECTED NOT DETECTED Final   Escherichia coli DETECTED (A) NOT DETECTED Final    Comment: CRITICAL RESULT CALLED TO, READ BACK BY AND VERIFIED WITH: PHARMD S HURTH [id number] AT 1315 BY CM    Klebsiella oxytoca NOT DETECTED NOT DETECTED Final   Klebsiella pneumoniae NOT DETECTED NOT DETECTED Final   Proteus species NOT DETECTED NOT DETECTED Final   Serratia marcescens NOT DETECTED NOT DETECTED Final   Carbapenem resistance NOT DETECTED  NOT DETECTED Final   Haemophilus influenzae NOT DETECTED NOT DETECTED Final   Neisseria meningitidis NOT DETECTED NOT DETECTED Final   Pseudomonas aeruginosa NOT DETECTED NOT DETECTED Final   Candida albicans NOT DETECTED NOT DETECTED Final   Candida glabrata NOT DETECTED NOT DETECTED Final   Candida krusei NOT DETECTED NOT DETECTED Final   Candida parapsilosis NOT DETECTED NOT DETECTED Final   Candida tropicalis NOT DETECTED NOT DETECTED Final    Comment: Performed at [facility], [place]. [address] [place], [place] [address]  Blood Culture (routine x 2)     Status: None (Preliminary result)   Collection Time: 02/03/20 12:53 AM   Specimen: Blood  Result Value Ref Range Status   Specimen Description RIGHT ANTECUBITAL  Final   Special Requests   Final    BOTTLES DRAWN AEROBIC AND ANAEROBIC Blood Culture adequate volume   Culture   Final    NO GROWTH < 12 HOURS Performed at [facility], [address], [place], [place] [address]    Report Status PENDING  Incomplete  Urine culture     Status: Abnormal (Preliminary result)   Collection Time: 02/03/20  1:24 AM   Specimen: In/Out Cath Urine  Result Value Ref Range Status   Specimen Description   Final    IN/OUT CATH URINE Performed at [facility], [address], [place], [place] [address]    Special Requests   Final    NONE Performed at [facility], [address] [place], [place] [address]    Culture >=100,000 COLONIES/mL ESCHERICHIA COLI (A)  Final   Report Status PENDING  Incomplete  SARS Coronavirus 2 by RT PCR (hospital order, performed in [place] hospital lab) Nasopharyngeal Nasopharyngeal Swab     Status: None   Collection Time: 02/03/20  1:24 AM   Specimen: Nasopharyngeal Swab  Result Value Ref Range Status   SARS Coronavirus 2 NEGATIVE NEGATIVE Final    Comment: (NOTE) SARS-CoV-2 target nucleic acids are NOT DETECTED.  The SARS-CoV-2 RNA is generally detectable in upper and lower respiratory specimens during  the acute phase of infection. The lowest concentration of SARS-CoV-2 viral copies this assay can detect is 250 copies / mL. A negative result does not preclude SARS-CoV-2 infection and should not be used as the sole basis for treatment or other patient management decisions.  A negative result may occur with improper specimen collection / handling, submission of specimen other than nasopharyngeal  swab, presence of viral mutation(s) within the areas targeted by this assay, and inadequate number of viral copies (<250 copies / mL). A negative result must be combined with clinical observations, patient history, and epidemiological information.  Fact Sheet for Patients:   StrictlyIdeas.no  Fact Sheet for Healthcare Providers: BankingDealers.co.za  This test is not yet approved or  cleared by the [place] FDA and has been authorized for detection and/or diagnosis of SARS-CoV-2 by FDA under an Emergency Use Authorization (EUA).  This EUA will remain in effect (meaning this test can be used) for the duration of the COVID-19 declaration under Section 564(b)(1) of the Act, 21 U.S.C. section 360bbb-3(b)(1), unless the authorization is terminated or revoked sooner.  Performed at [facility], [address] [place], [place] [address]      Radiology Studies: CT ABDOMEN PELVIS W CONTRAST  Result Date: 02/03/2020 CLINICAL DATA:  Fever and hyperglycemia EXAM: CT ABDOMEN AND PELVIS WITH CONTRAST TECHNIQUE: Multidetector CT imaging of the abdomen and pelvis was performed using the standard protocol following bolus administration of intravenous contrast. CONTRAST:  112m OMNIPAQUE IOHEXOL 300 MG/ML  SOLN COMPARISON:  None. FINDINGS: LOWER CHEST: Normal. HEPATOBILIARY: Unchanged subcapsular hemangioma of the right hepatic lobe. Liver otherwise unremarkable. Normal gallbladder PANCREAS: Normal pancreas. No ductal dilatation or peripancreatic fluid  collection. SPLEEN: Normal. ADRENALS/URINARY TRACT: The adrenal glands are normal. No hydronephrosis, nephroureterolithiasis or solid renal mass. The urinary bladder is normal for degree of distention STOMACH/BOWEL: There is no hiatal hernia. Normal duodenal course and caliber. No small bowel dilatation or inflammation. No focal colonic abnormality. Normal appendix. VASCULAR/LYMPHATIC: There is calcific atherosclerosis of the abdominal aorta. No lymphadenopathy. REPRODUCTIVE: Enlarged prostate measures 6 cm in transverse dimension. MUSCULOSKELETAL. No bony spinal canal stenosis or focal osseous abnormality. OTHER: None. IMPRESSION: 1. No acute abnormality of the abdomen or pelvis. 2. Unchanged subcapsular hemangioma of the right hepatic lobe. 3. Aortic Atherosclerosis (ICD10-I70.0). Electronically Signed   By: KUlyses JarredM.D.   On: 02/03/2020 05:33   DG Chest Port 1 View  Result Date: 02/03/2020 CLINICAL DATA:  Fever EXAM: PORTABLE CHEST 1 VIEW COMPARISON:  09/03/2014 FINDINGS: The heart size and mediastinal contours are within normal limits. Both lungs are clear. The visualized skeletal structures are unremarkable. IMPRESSION: No active disease. Electronically Signed   By: KUlyses JarredM.D.   On: 02/03/2020 01:17    Scheduled Meds: . vitamin C  500 mg Oral q morning - 10a  . aspirin EC  81 mg Oral Daily  . doxazosin  2 mg Oral Daily  . enoxaparin (LOVENOX) injection  55 mg Subcutaneous Q24H  . fluticasone  1 spray Each Nare Daily  . insulin aspart  0-15 Units Subcutaneous TID WC  . insulin aspart  0-5 Units Subcutaneous QHS  . magnesium gluconate  1,000 mg Oral q morning - 10a  . rosuvastatin  10 mg Oral Weekly   Continuous Infusions: . meropenem (MERREM) IV 1 g (02/04/20 1137)     LOS: 1 day    Time spent: 35 minutes    CBarton Dubois MD [facility]   To contact the attending provider between 7A-7P or the covering provider during after hours 7P-7A, please log into the  web site [email or web address] and access using universal [place] password for that web site. If you do not have the password, please call the hospital operator.  02/04/2020, 2:29 PM

## 2020-02-04 NOTE — Progress Notes (Signed)
PHARMACY - PHYSICIAN COMMUNICATION CRITICAL VALUE ALERT - BLOOD CULTURE IDENTIFICATION (BCID)  [name] is an 64 y.o. male who presented to [facility] on 02/03/2020 w  Assessment:  E. Coli bacteremia  Name of physician (or Provider) Contacted: Dr Dyann Kief  Current antibiotics: Meropenem 1000 mg IV every 8 hours.  Changes to prescribed antibiotics recommended:  Patient is on recommended antibiotics - No changes needed  Results for orders placed or performed during the hospital encounter of 02/03/20  Blood Culture ID Panel (Reflexed) (Collected: 02/03/2020 12:48 AM)  Result Value Ref Range   Enterococcus species NOT DETECTED NOT DETECTED   Listeria monocytogenes NOT DETECTED NOT DETECTED   Staphylococcus species NOT DETECTED NOT DETECTED   Staphylococcus aureus (BCID) NOT DETECTED NOT DETECTED   Streptococcus species NOT DETECTED NOT DETECTED   Streptococcus agalactiae NOT DETECTED NOT DETECTED   Streptococcus pneumoniae NOT DETECTED NOT DETECTED   Streptococcus pyogenes NOT DETECTED NOT DETECTED   Acinetobacter baumannii NOT DETECTED NOT DETECTED   Enterobacteriaceae species DETECTED (A) NOT DETECTED   Enterobacter cloacae complex NOT DETECTED NOT DETECTED   Escherichia coli DETECTED (A) NOT DETECTED   Klebsiella oxytoca NOT DETECTED NOT DETECTED   Klebsiella pneumoniae NOT DETECTED NOT DETECTED   Proteus species NOT DETECTED NOT DETECTED   Serratia marcescens NOT DETECTED NOT DETECTED   Carbapenem resistance NOT DETECTED NOT DETECTED   Haemophilus influenzae NOT DETECTED NOT DETECTED   Neisseria meningitidis NOT DETECTED NOT DETECTED   Pseudomonas aeruginosa NOT DETECTED NOT DETECTED   Candida albicans NOT DETECTED NOT DETECTED   Candida glabrata NOT DETECTED NOT DETECTED   Candida krusei NOT DETECTED NOT DETECTED   Candida parapsilosis NOT DETECTED NOT DETECTED   Candida tropicalis NOT DETECTED NOT DETECTED    [name] 02/04/2020  1:18 PM

## 2020-02-05 DIAGNOSIS — R7881 Bacteremia: Secondary | ICD-10-CM

## 2020-02-05 LAB — URINE CULTURE: Culture: >=100000 — AB

## 2020-02-05 LAB — GLUCOSE, CAPILLARY
Glucose-Capillary: 172 mg/dL — ABNORMAL HIGH (ref 70–99)
Glucose-Capillary: 191 mg/dL — ABNORMAL HIGH (ref 70–99)
Glucose-Capillary: 198 mg/dL — ABNORMAL HIGH (ref 70–99)
Glucose-Capillary: 248 mg/dL — ABNORMAL HIGH (ref 70–99)

## 2020-02-05 MED ORDER — MUSCLE RUB 10-15 % EX CREA
1.0000 "application " | TOPICAL_CREAM | CUTANEOUS | Status: DC | PRN
  Administered 2020-02-05: 1 via TOPICAL
  Filled 2020-02-05 (×2): qty 85

## 2020-02-05 MED ORDER — CEFDINIR 300 MG PO CAPS
300.0000 mg | ORAL_CAPSULE | Freq: Two times a day (BID) | ORAL | Status: DC
  Administered 2020-02-05 – 2020-02-06 (×2): 300 mg via ORAL
  Filled 2020-02-05 (×2): qty 1

## 2020-02-05 MED ORDER — MAGNESIUM OXIDE 400 (241.3 MG) MG PO TABS
400.0000 mg | ORAL_TABLET | Freq: Every day | ORAL | Status: DC
  Administered 2020-02-05 – 2020-02-06 (×2): 400 mg via ORAL
  Filled 2020-02-05 (×2): qty 1

## 2020-02-05 NOTE — Progress Notes (Signed)
Pt stated discomfort to neck and shoulders ordered prn muscle rub

## 2020-02-05 NOTE — Progress Notes (Signed)
PROGRESS NOTE    [name]  CZY:[id number] DOB: [birth year] DOA: 02/03/2020 PCP: [name], MD   Chief Complaint  Patient presents with  . Fever    Brief Narrative:  [name] is a 64 y.o. male with medical history significant of type 2 diabetes, hypertension, gastroesophageal reflux disease, class I obesity, BPH and hyperlipidemia; who presented to the hospital secondary to general malaise and fever. Patient initial main concern was incorrectly taking too much hypoglycemic medications after having trouble cutting his pills. Subsequent concern of development of hypoglycemia. EMS was contacted. On arrival he was found to be febrile with a blood sugar in the 280 range, tachycardic and very tachypneic. Patient has a low normal oxygen saturation in the 90% and was placed on 2 L nasal cannula by EMS. Patient has been transported to the hospital for further evaluation and management. Patient reported having some intermittent dysuria and recently seen his urologist for prostate biopsy.   Patient expressed no having chest pain, no nausea, no vomiting, no abdominal pain, no headaches, no hematuria, no melena, no hematochezia, no focal weakness or any other complaints.  ED Course: Patient met sepsis criteria with high-grade temperature, tachycardia, tachypnea, mild elevation of WBCs, lactic acid of 3.3 presumed source of infection from his urinary tract. Cultures taken, fluid resuscitation per sepsis protocol initiated and broad-spectrum antibiotics started. [facility] has been contacted to admit patient for further evaluation and management. Covid test negative.   Assessment & Plan: 1-sepsis secondary to E. coli UTI/bacteremia: Present on admission. -Continue supportive care and as needed antipyretics -Antibiotic has been adjusted based on culture results and sensitivity; after discussing with infectious disease doctor (Dr. Carlyle Basques) and experiencing so far almost a complete 24 hours without  fever, will transition antibiotics to cefdinir 300 mg twice a day and will look to treat for a total of 7 days (counting from 02/05/2020) -Advised to maintain adequate hydration and continue supportive care. -Hoping discharge home in the morning if he tolerated antibiotics well and remains afebrile.   2-class I obesity due to excess calories  -Body mass index is 32.6 kg/m. -Low calorie diet, portion control increase physical activity recommended.  3-essential hypertension -Blood pressure stable and well-controlled -Continue to follow vital signs while holding antihypertensive agents at this point. -Healthy diet recommended.  4-type 2 diabetes not on insulin therapy -A1c 7.9 -Continue sliding scale insulin modified carbohydrate diet.  5-lipidemia -Continue statins.  6-BPH -Continue Cardura -Patient reports no urinary retention symptoms at this time.   DVT prophylaxis: Lovenox Code Status: Full code Family Communication: Wife at bedside. Disposition:   Status is: Inpatient  Dispo: The patient is from: Home              Anticipated d/c is to: Home              Anticipated d/c date is: 02/06/20              Patient currently no medically stable for discharge, still spiking high-grade temperatures and now with a positive blood culture.  Continue IV antibiotics, continue antipyretics, gentle hydration and supportive care.    Consultants:   Infectious disease (Dr. Baxter Flattery): Recommendation to transition to cefdinir and treat for a total 7 days counting as a first-day 02/05/2020.  Procedures:  See below for x-ray reports  Antimicrobials:  Vancomycin and cefepime 02/03/2020>> 02/04/2020 -Meropenem and 02/04/20>>>02/05/20 Cefdinir 02/05/20>> looking to treat for 7 days.  Subjective: Currently afebrile, no chest pain, no nausea, no vomiting.  Reports no dysuria or abdominal pain at this time.  Feeling much better.  Objective: Vitals:   02/04/20 0504 02/04/20 1616 02/04/20 2202  02/05/20 1345  BP: (!) 107/49 (!) 140/76 (!) 133/63 (!) 137/75  Pulse: 68 70 83 67  Resp: '16 16 20 20  '$ Temp: 97.7 F (36.5 C) 99.5 F (37.5 C) 98.7 F (37.1 C) 98.9 F (37.2 C)  TempSrc: Oral Oral Oral Oral  SpO2: 94% 97% 98% 93%  Weight:      Height:        Intake/Output Summary (Last 24 hours) at 02/05/2020 1656 Last data filed at 02/05/2020 0532 Gross per 24 hour  Intake --  Output 450 ml  Net -450 ml   Filed Weights   02/03/20 0037  Weight: (!) 113.6 kg    Examination: General exam: Alert, awake, oriented x 3; currently afebrile, no chest pain, no nausea, no vomiting.  Reports feeling much better and is in no acute distress.  No abdominal pain or dysuria reported. Respiratory system: Clear to auscultation. Respiratory effort normal. Cardiovascular system:RRR. No murmurs, rubs, gallops. Gastrointestinal system: Abdomen is nondistended, soft and nontender. No organomegaly or masses felt. Normal bowel sounds heard. Central nervous system: Alert and oriented. No focal neurological deficits. Extremities: No C/C/E, +pedal pulses Skin: No rashes, lesions or ulcers Psychiatry: Judgement and insight appear normal. Mood & affect appropriate.    Data Reviewed: I have personally reviewed following labs and imaging studies  CBC: Recent Labs  Lab 02/03/20 0048 02/04/20 0631  WBC 10.6* 13.1*  NEUTROABS 9.9*  --   HGB 14.1 13.7  HCT 43.6 43.6  MCV 90.6 94.2  PLT 161 142*    Basic Metabolic Panel: Recent Labs  Lab 02/03/20 0048 02/03/20 0833 02/04/20 0631  NA 134*  --  137  K 3.7  --  3.9  CL 104  --  106  CO2 19*  --  21*  GLUCOSE 293*  --  125*  BUN 17  --  13  CREATININE 0.90  --  0.70  CALCIUM 8.5*  --  8.2*  MG  --  1.9  --   PHOS  --  4.5  --     GFR: Estimated Creatinine Clearance: 127.4 mL/min (by C-G formula based on SCr of 0.7 mg/dL).  Liver Function Tests: Recent Labs  Lab 02/03/20 0048  AST 16  ALT 18  ALKPHOS 95  BILITOT 1.0  PROT 6.6   ALBUMIN 3.7    CBG: Recent Labs  Lab 02/04/20 1608 02/04/20 2159 02/05/20 0737 02/05/20 1112 02/05/20 1646  GLUCAP 171* 195* 172* 191* 198*     Recent Results (from the past 240 hour(s))  Blood Culture (routine x 2)     Status: Abnormal (Preliminary result)   Collection Time: 02/03/20 12:48 AM   Specimen: BLOOD RIGHT HAND  Result Value Ref Range Status   Specimen Description   Final    BLOOD RIGHT HAND Performed at [facility], [address] [place], [place] [address]    Special Requests   Final    BOTTLES DRAWN AEROBIC AND ANAEROBIC Blood Culture adequate volume Performed at [facility], [address], [place], [place] [address]    Culture  Setup Time   Final    GRAM NEGATIVE RODS ANAEROBIC BOTTLE ONLY CRITICAL RESULT CALLED TO, READ BACK BY AND VERIFIED WITH: PHARMD S [place] [id number] AT 41 BY CM    Culture (A)  Final    ESCHERICHIA COLI SUSCEPTIBILITIES TO FOLLOW Performed  at [facility], [place] [address], [place], [place] [address]    Report Status PENDING  Incomplete  Blood Culture ID Panel (Reflexed)     Status: Abnormal   Collection Time: 02/03/20 12:48 AM  Result Value Ref Range Status   Enterococcus species NOT DETECTED NOT DETECTED Final   Listeria monocytogenes NOT DETECTED NOT DETECTED Final   Staphylococcus species NOT DETECTED NOT DETECTED Final   Staphylococcus aureus (BCID) NOT DETECTED NOT DETECTED Final   Streptococcus species NOT DETECTED NOT DETECTED Final   Streptococcus agalactiae NOT DETECTED NOT DETECTED Final   Streptococcus pneumoniae NOT DETECTED NOT DETECTED Final   Streptococcus pyogenes NOT DETECTED NOT DETECTED Final   Acinetobacter baumannii NOT DETECTED NOT DETECTED Final   Enterobacteriaceae species DETECTED (A) NOT DETECTED Final    Comment: Enterobacteriaceae represent a large family of gram-negative bacteria, not a single organism. CRITICAL RESULT CALLED TO, READ BACK BY AND VERIFIED WITH: PHARMD S HURTH [id number] RK [id number]  BY CM    Enterobacter cloacae complex NOT DETECTED NOT DETECTED Final   Escherichia coli DETECTED (A) NOT DETECTED Final    Comment: CRITICAL RESULT CALLED TO, READ BACK BY AND VERIFIED WITH: PHARMD S HURTH [id number] AT 1315 BY CM    Klebsiella oxytoca NOT DETECTED NOT DETECTED Final   Klebsiella pneumoniae NOT DETECTED NOT DETECTED Final   Proteus species NOT DETECTED NOT DETECTED Final   Serratia marcescens NOT DETECTED NOT DETECTED Final   Carbapenem resistance NOT DETECTED NOT DETECTED Final   Haemophilus influenzae NOT DETECTED NOT DETECTED Final   Neisseria meningitidis NOT DETECTED NOT DETECTED Final   Pseudomonas aeruginosa NOT DETECTED NOT DETECTED Final   Candida albicans NOT DETECTED NOT DETECTED Final   Candida glabrata NOT DETECTED NOT DETECTED Final   Candida krusei NOT DETECTED NOT DETECTED Final   Candida parapsilosis NOT DETECTED NOT DETECTED Final   Candida tropicalis NOT DETECTED NOT DETECTED Final    Comment: Performed at [facility], [place]. [address] [place], [place] [address]  Blood Culture (routine x 2)     Status: None (Preliminary result)   Collection Time: 02/03/20 12:53 AM   Specimen: Right Antecubital; Blood  Result Value Ref Range Status   Specimen Description RIGHT ANTECUBITAL  Final   Special Requests   Final    BOTTLES DRAWN AEROBIC AND ANAEROBIC Blood Culture adequate volume   Culture   Final    NO GROWTH 2 DAYS Performed at [facility], [address] [place], [place] [address]    Report Status PENDING  Incomplete  Urine culture     Status: Abnormal   Collection Time: 02/03/20  1:24 AM   Specimen: In/Out Cath Urine  Result Value Ref Range Status   Specimen Description   Final    IN/OUT CATH URINE Performed at [facility], [address], [place], [place] [address]    Special Requests   Final    NONE Performed at [facility], [address], [place], [place] [address]    Culture >=100,000 COLONIES/mL ESCHERICHIA COLI (A)  Final    Report Status 02/05/2020 FINAL  Final   Organism ID, Bacteria ESCHERICHIA COLI (A)  Final      Susceptibility   Escherichia coli - MIC*    AMPICILLIN <=2 SENSITIVE Sensitive     CEFAZOLIN <=4 SENSITIVE Sensitive     CEFTRIAXONE <=0.25 SENSITIVE Sensitive     CIPROFLOXACIN >=4 RESISTANT Resistant     GENTAMICIN <=1 SENSITIVE Sensitive  IMIPENEM <=0.25 SENSITIVE Sensitive     NITROFURANTOIN <=16 SENSITIVE Sensitive     TRIMETH/SULFA <=20 SENSITIVE Sensitive     AMPICILLIN/SULBACTAM <=2 SENSITIVE Sensitive     PIP/TAZO <=4 SENSITIVE Sensitive     * >=100,000 COLONIES/mL ESCHERICHIA COLI  SARS Coronavirus 2 by RT PCR (hospital order, performed in [facility] hospital lab) Nasopharyngeal Nasopharyngeal Swab     Status: None   Collection Time: 02/03/20  1:24 AM   Specimen: Nasopharyngeal Swab  Result Value Ref Range Status   SARS Coronavirus 2 NEGATIVE NEGATIVE Final    Comment: (NOTE) SARS-CoV-2 target nucleic acids are NOT DETECTED.  The SARS-CoV-2 RNA is generally detectable in upper and lower respiratory specimens during the acute phase of infection. The lowest concentration of SARS-CoV-2 viral copies this assay can detect is 250 copies / mL. A negative result does not preclude SARS-CoV-2 infection and should not be used as the sole basis for treatment or other patient management decisions.  A negative result may occur with improper specimen collection / handling, submission of specimen other than nasopharyngeal swab, presence of viral mutation(s) within the areas targeted by this assay, and inadequate number of viral copies (<250 copies / mL). A negative result must be combined with clinical observations, patient history, and epidemiological information.  Fact Sheet for Patients:   StrictlyIdeas.no  Fact Sheet for Healthcare Providers: BankingDealers.co.za  This test is not yet approved or  cleared by the [place] FDA  and has been authorized for detection and/or diagnosis of SARS-CoV-2 by FDA under an Emergency Use Authorization (EUA).  This EUA will remain in effect (meaning this test can be used) for the duration of the COVID-19 declaration under Section 564(b)(1) of the Act, 21 U.S.C. section 360bbb-3(b)(1), unless the authorization is terminated or revoked sooner.  Performed at [facility], [address] [place], [place] [address]      Radiology Studies: No results found.  Scheduled Meds: . vitamin C  500 mg Oral q morning - 10a  . aspirin EC  81 mg Oral Daily  . cefdinir  300 mg Oral Q12H  . doxazosin  2 mg Oral Daily  . enoxaparin (LOVENOX) injection  55 mg Subcutaneous Q24H  . fluticasone  1 spray Each Nare Daily  . insulin aspart  0-15 Units Subcutaneous TID WC  . insulin aspart  0-5 Units Subcutaneous QHS  . magnesium oxide  400 mg Oral Daily  . rosuvastatin  10 mg Oral Weekly   Continuous Infusions:    LOS: 2 days    Time spent: 30 minutes    Barton Dubois, MD [facility]   To contact the attending provider between 7A-7P or the covering provider during after hours 7P-7A, please log into the web site [email or web address] and access using universal [place] password for that web site. If you do not have the password, please call the hospital operator.  02/05/2020, 4:56 PM

## 2020-02-06 LAB — CULTURE, BLOOD (ROUTINE X 2): Special Requests: ADEQUATE

## 2020-02-06 LAB — GLUCOSE, CAPILLARY
Glucose-Capillary: 198 mg/dL — ABNORMAL HIGH (ref 70–99)
Glucose-Capillary: 222 mg/dL — ABNORMAL HIGH (ref 70–99)

## 2020-02-06 MED ORDER — OMEPRAZOLE 40 MG PO CPDR: 40.0000 mg | DELAYED_RELEASE_CAPSULE | Freq: Two times a day (BID) | ORAL | 1 refills | Status: AC

## 2020-02-06 MED ORDER — CEFDINIR 300 MG PO CAPS: 300.0000 mg | ORAL_CAPSULE | Freq: Two times a day (BID) | ORAL | 0 refills | Status: AC

## 2020-02-06 MED ORDER — ACETAMINOPHEN 325 MG PO TABS: 650.0000 mg | ORAL_TABLET | Freq: Four times a day (QID) | ORAL | 0 refills | Status: AC | PRN

## 2020-02-06 NOTE — Discharge Summary (Signed)
Physician Discharge Summary  CHARON AKAMINE JJH:[id number] DOB: 1956/06/18 DOA: 02/03/2020  PCP: [name], MD  Admit date: 02/03/2020 Discharge date: 02/06/2020  Time spent: 35 minutes  Recommendations for Outpatient Follow-up:  1. Repeat basic metabolic panel to follow lites and renal function 2. Repeat CBC to assure complete resolution of WBCs. 3. Reassess blood pressure and adjust antihypertensive regimen as needed 4. Continue to follow patient's blood sugar and adjust hypoglycemic regimen as required.   Discharge Diagnoses:  Principal Problem:   Sepsis ([place]) Active Problems:   Class 1 obesity due to excess calories with body mass index (BMI) of 32.0 to 32.9 in adult   HTN (hypertension)   HLD (hyperlipidemia)   BPH (benign prostatic hyperplasia)   Discharge Condition: Stable and improved.  Discharged home with instruction to follow-up with PCP in 10 days.  CODE STATUS: Full code.  Diet recommendation: Heart healthy, modified carbohydrate and low calorie diet.  Filed Weights   02/03/20 0037  Weight: (!) 113.6 kg    History of present illness:  [name] a 64 y.o.malewith medical history significant oftype 2 diabetes, hypertension, gastroesophageal reflux disease, class I obesity, BPH and hyperlipidemia; who presented to the hospital secondary to general malaise and fever. Patient initial main concern was incorrectly taking too much hypoglycemic medications after having trouble cutting his pills. Subsequent concern of development of hypoglycemia. EMS was contacted. On arrival he was found to be febrile with a blood sugar in the 280 range, tachycardic and very tachypneic. Patient has a low normal oxygen saturation in the 90% and was placed on 2 L nasal cannula by EMS. Patient has been transported to the hospital for further evaluation and management. Patient reported having some intermittent dysuria and recently seen his urologist for prostate biopsy.   Patient expressed  no having chest pain, no nausea, no vomiting, no abdominal pain, no headaches, no hematuria, no melena, no hematochezia, no focal weakness or any other complaints.  ED Course:Patient met sepsis criteria with high-grade temperature, tachycardia, tachypnea, mild elevation of WBCs, lactic acid of 3.3 presumed source of infection from his urinary tract. Cultures taken, fluid resuscitation per sepsis protocol initiated and broad-spectrum antibiotics started. [facility] has been contacted to admit patient for further evaluation and management. Covid test negative.  Hospital Course:  1-sepsis secondary to E. coli UTI/bacteremia: Present on admission. -Continue to maintain adequate hydration and continue the use of as needed Tylenol for symptomatic management of fever. -Antibiotic has been adjusted based on culture results and sensitivity;  microorganism resistant to ciprofloxacin; otherwise pansensitive.  After IV antibiotics for 3 days and accomplishing been afebrile for 24 hours patient will be discharged home on cefdinir 300 mg twice a day to complete a total 7 days.  Case discussed with infectious disease Dr. For recommendations and therapy.   -Outpatient follow-up with PCP in 10 days.  2-class I obesity due to excess calories  -Body mass index is 32.6 kg/m. -Low calorie diet, portion control increase physical activity recommended.  3-essential hypertension -Blood pressure stable and well-controlled. -Heart healthy diet has been encouraged -Reassess blood pressure at follow-up visit and adjust antihypertensive regimen as required.  4-type 2 diabetes not on insulin therapy -A1c 7.9 -Continue to follow modified carbohydrate diet -Resume oral hypoglycemic agents and outpatient follow-up with PCP.  5-lipidemia -Continue statins. -Heart healthy diet has been encouraged.  6-BPH -Continue Cardura -Patient reports no urinary retention symptoms at this time. -Continue outpatient follow-up with  urology service.  Procedures: See below for  x-ray report  Consultations:  ID curbside (Dr. Carlyle Basques)  Discharge Exam: Vitals:   02/06/20 0433 02/06/20 0759  BP: (!) 119/64   Pulse: 58   Resp: 19   Temp: 97.7 F (36.5 C) 97.8 F (36.6 C)  SpO2: 97%     General: No fever, no chest pain, no nausea, no vomiting.  Patient reports feeling good and ready to go home.  No dysuria. Cardiovascular: S1 and S2, no rubs, no gallops, no murmurs.  No JVD. Respiratory: Clear to auscultation bilaterally; no using accessory muscle.  No using oxygen supplementation. Abdomen: Soft, nontender, distended, positive bowel sounds Extremities: No cyanosis or clubbing.  Discharge Instructions   Discharge Instructions    Diet - low sodium heart healthy   Complete by: As directed    Discharge instructions   Complete by: As directed    Take medications as prescribed Maintain adequate hydration Use Tylenol as needed for pain/fever as discussed. Arrange follow-up with PCP in 10 days. Follow-up with urology service as previously instructed. Follow heart healthy and modify carbohydrate diet.     Allergies as of 02/06/2020      Reactions   Vicodin [hydrocodone-acetaminophen] Rash, Other (See Comments)   Sweating, Unconsciousness, fainting    Niacin Other (See Comments)   Warning- Niacin red skin and mild sweating   Niacin And Related    Rabeprazole       Medication List    STOP taking these medications   amLODipine 10 MG tablet Commonly known as: NORVASC   Calcium Carb-Ergocalciferol 500-200 MG-UNIT Tabs   clobetasol ointment 0.05 % Commonly known as: TEMOVATE   clotrimazole-betamethasone cream Commonly known as: Lotrisone   Fenofibrate 150 MG Caps   hydrochlorothiazide 25 MG tablet Commonly known as: HYDRODIURIL   Lantus SoloStar 100 UNIT/ML Solostar Pen Generic drug: insulin glargine   magnesium oxide 400 MG tablet Commonly known as: MAG-OX   NovoLOG FlexPen 100  UNIT/ML FlexPen Generic drug: insulin aspart   ranitidine 150 MG tablet Commonly known as: ZANTAC   simvastatin 10 MG tablet Commonly known as: ZOCOR   traMADol 50 MG tablet Commonly known as: ULTRAM     TAKE these medications   acetaminophen 325 MG tablet Commonly known as: TYLENOL Take 2 tablets (650 mg total) by mouth every 6 (six) hours as needed for mild pain or headache (or Fever >/= 101).   aspirin EC 81 MG tablet Take 81 mg by mouth daily.   cefdinir 300 MG capsule Commonly known as: OMNICEF Take 1 capsule (300 mg total) by mouth every 12 (twelve) hours for 7 days.   CENTRUM SILVER ADULT 50+ PO Take 1 tablet by mouth every morning.   doxazosin 2 MG tablet Commonly known as: CARDURA Take 2 mg by mouth daily.   fish oil-omega-3 fatty acids 1000 MG capsule Take 1 g by mouth 2 (two) times daily.   fluticasone 50 MCG/ACT nasal spray Commonly known as: FLONASE Place into both nostrils daily as needed.   Jardiance 25 MG Tabs tablet Generic drug: empagliflozin Take 12.5 mg by mouth.   losartan 100 MG tablet Commonly known as: COZAAR Take 100 mg by mouth daily.   magnesium gluconate 500 MG tablet Commonly known as: MAGONATE Take 1,000 mg by mouth every morning.   Masks Misc 1 Device by Does not apply route at bedtime. Patient uses p10 mask when sleeping.   metFORMIN 1000 MG tablet Commonly known as: GLUCOPHAGE Take 1,000 mg by mouth 2 (two) times daily with a  meal.   omeprazole 40 MG capsule Commonly known as: PRILOSEC Take 1 capsule (40 mg total) by mouth 2 (two) times daily.   rosuvastatin 10 MG tablet Commonly known as: CRESTOR Take 10 mg by mouth once a week. On sundays   vitamin C 500 MG tablet Commonly known as: ASCORBIC ACID Take 500 mg by mouth every morning.      Allergies  Allergen Reactions  . Vicodin [Hydrocodone-Acetaminophen] Rash and Other (See Comments)    Sweating, Unconsciousness, fainting   . Niacin Other (See Comments)     Warning- Niacin red skin and mild sweating  . Niacin And Related   . Rabeprazole     Follow-up Information    [name], MD. Schedule an appointment as soon as possible for a visit in 10 day(s).   Specialty: Internal Medicine Contact information: [address] [place] [address] [phone]               The results of significant diagnostics from this hospitalization (including imaging, microbiology, ancillary and laboratory) are listed below for reference.    Significant Diagnostic Studies: CT ABDOMEN PELVIS W CONTRAST  Result Date: 02/03/2020 CLINICAL DATA:  Fever and hyperglycemia EXAM: CT ABDOMEN AND PELVIS WITH CONTRAST TECHNIQUE: Multidetector CT imaging of the abdomen and pelvis was performed using the standard protocol following bolus administration of intravenous contrast. CONTRAST:  131m OMNIPAQUE IOHEXOL 300 MG/ML  SOLN COMPARISON:  None. FINDINGS: LOWER CHEST: Normal. HEPATOBILIARY: Unchanged subcapsular hemangioma of the right hepatic lobe. Liver otherwise unremarkable. Normal gallbladder PANCREAS: Normal pancreas. No ductal dilatation or peripancreatic fluid collection. SPLEEN: Normal. ADRENALS/URINARY TRACT: The adrenal glands are normal. No hydronephrosis, nephroureterolithiasis or solid renal mass. The urinary bladder is normal for degree of distention STOMACH/BOWEL: There is no hiatal hernia. Normal duodenal course and caliber. No small bowel dilatation or inflammation. No focal colonic abnormality. Normal appendix. VASCULAR/LYMPHATIC: There is calcific atherosclerosis of the abdominal aorta. No lymphadenopathy. REPRODUCTIVE: Enlarged prostate measures 6 cm in transverse dimension. MUSCULOSKELETAL. No bony spinal canal stenosis or focal osseous abnormality. OTHER: None. IMPRESSION: 1. No acute abnormality of the abdomen or pelvis. 2. Unchanged subcapsular hemangioma of the right hepatic lobe. 3. Aortic Atherosclerosis (ICD10-I70.0). Electronically Signed    By: KUlyses JarredM.D.   On: 02/03/2020 05:33   DG Chest Port 1 View  Result Date: 02/03/2020 CLINICAL DATA:  Fever EXAM: PORTABLE CHEST 1 VIEW COMPARISON:  09/03/2014 FINDINGS: The heart size and mediastinal contours are within normal limits. Both lungs are clear. The visualized skeletal structures are unremarkable. IMPRESSION: No active disease. Electronically Signed   By: KUlyses JarredM.D.   On: 02/03/2020 01:17    Microbiology: Recent Results (from the past 240 hour(s))  Blood Culture (routine x 2)     Status: Abnormal   Collection Time: 02/03/20 12:48 AM   Specimen: BLOOD RIGHT HAND  Result Value Ref Range Status   Specimen Description   Final    BLOOD RIGHT HAND Performed at [facility] [address] [place] [address]   Special Requests   Final    BOTTLES DRAWN AEROBIC AND ANAEROBIC Blood Culture adequate volume Performed at [facility] [address], [place]  [id number]   Culture  Setup Time   Final    GRAM NEGATIVE RODS ANAEROBIC BOTTLE ONLY CRITICAL RESULT CALLED TO, READ BACK BY AND VERIFIED WITH: PCloyde [id number] 1315 BY CM Performed at [facility] [address]  [place] [address]    Culture ESCHERICHIA COLI (A)  Final   Report Status 02/06/2020 FINAL  Final   Organism ID, Bacteria ESCHERICHIA COLI  Final      Susceptibility   Escherichia coli - MIC*    AMPICILLIN <=2 SENSITIVE Sensitive     CEFAZOLIN <=4 SENSITIVE Sensitive     CEFEPIME <=0.12 SENSITIVE Sensitive     CEFTAZIDIME <=1 SENSITIVE Sensitive     CEFTRIAXONE <=0.25 SENSITIVE Sensitive     CIPROFLOXACIN >=4 RESISTANT Resistant     GENTAMICIN <=1 SENSITIVE Sensitive     IMIPENEM <=0.25 SENSITIVE Sensitive     TRIMETH/SULFA <=20 SENSITIVE Sensitive     AMPICILLIN/SULBACTAM <=2 SENSITIVE Sensitive     PIP/TAZO <=4 SENSITIVE Sensitive     * ESCHERICHIA COLI  Blood Culture ID Panel (Reflexed)     Status: Abnormal   Collection Time: 02/03/20 12:48 AM   Result Value Ref Range Status   Enterococcus species NOT DETECTED NOT DETECTED Final   Listeria monocytogenes NOT DETECTED NOT DETECTED Final   Staphylococcus species NOT DETECTED NOT DETECTED Final   Staphylococcus aureus (BCID) NOT DETECTED NOT DETECTED Final   Streptococcus species NOT DETECTED NOT DETECTED Final   Streptococcus agalactiae NOT DETECTED NOT DETECTED Final   Streptococcus pneumoniae NOT DETECTED NOT DETECTED Final   Streptococcus pyogenes NOT DETECTED NOT DETECTED Final   Acinetobacter baumannii NOT DETECTED NOT DETECTED Final   Enterobacteriaceae species DETECTED (A) NOT DETECTED Final    Comment: Enterobacteriaceae represent a large family of gram-negative bacteria, not a single organism. CRITICAL RESULT CALLED TO, READ BACK BY AND VERIFIED WITH: PHARMD S HURTH [id number] YI [id number] BY CM    Enterobacter cloacae complex NOT DETECTED NOT DETECTED Final   Escherichia coli DETECTED (A) NOT DETECTED Final    Comment: CRITICAL RESULT CALLED TO, READ BACK BY AND VERIFIED WITH: PHARMD S HURTH [id number] AT 1315 BY CM    Klebsiella oxytoca NOT DETECTED NOT DETECTED Final   Klebsiella pneumoniae NOT DETECTED NOT DETECTED Final   Proteus species NOT DETECTED NOT DETECTED Final   Serratia marcescens NOT DETECTED NOT DETECTED Final   Carbapenem resistance NOT DETECTED NOT DETECTED Final   Haemophilus influenzae NOT DETECTED NOT DETECTED Final   Neisseria meningitidis NOT DETECTED NOT DETECTED Final   Pseudomonas aeruginosa NOT DETECTED NOT DETECTED Final   Candida albicans NOT DETECTED NOT DETECTED Final   Candida glabrata NOT DETECTED NOT DETECTED Final   Candida krusei NOT DETECTED NOT DETECTED Final   Candida parapsilosis NOT DETECTED NOT DETECTED Final   Candida tropicalis NOT DETECTED NOT DETECTED Final    Comment: Performed at [facility], [place]. [address], [place], [place] [address]  Blood Culture (routine x 2)     Status: None (Preliminary result)   Collection Time:  02/03/20 12:53 AM   Specimen: Right Antecubital; Blood  Result Value Ref Range Status   Specimen Description RIGHT ANTECUBITAL  Final   Special Requests   Final    BOTTLES DRAWN AEROBIC AND ANAEROBIC Blood Culture adequate volume   Culture   Final    NO GROWTH 2 DAYS Performed at [facility], [address] [place], [place] [address]    Report Status PENDING  Incomplete  Urine culture     Status: Abnormal   Collection Time: 02/03/20  1:24 AM   Specimen: In/Out Cath Urine  Result Value Ref Range Status   Specimen Description   Final    IN/OUT CATH URINE Performed at [facility], [address] [place], [place] [address]    Special Requests   Final    NONE Performed at [facility], [address], [place], [place] [address]    Culture >=100,000 COLONIES/mL ESCHERICHIA COLI (A)  Final   Report Status 02/05/2020 FINAL  Final   Organism ID, Bacteria ESCHERICHIA COLI (A)  Final      Susceptibility   Escherichia coli - MIC*    AMPICILLIN <=2 SENSITIVE Sensitive     CEFAZOLIN <=4 SENSITIVE Sensitive     CEFTRIAXONE <=0.25 SENSITIVE Sensitive     CIPROFLOXACIN >=4 RESISTANT Resistant     GENTAMICIN <=1 SENSITIVE Sensitive     IMIPENEM <=0.25 SENSITIVE Sensitive     NITROFURANTOIN <=16 SENSITIVE Sensitive     TRIMETH/SULFA <=20 SENSITIVE Sensitive     AMPICILLIN/SULBACTAM <=2 SENSITIVE Sensitive     PIP/TAZO <=4 SENSITIVE Sensitive     * >=100,000 COLONIES/mL ESCHERICHIA COLI  SARS Coronavirus 2 by RT PCR (hospital order, performed in [place] hospital lab) Nasopharyngeal Nasopharyngeal Swab     Status: None   Collection Time: 02/03/20  1:24 AM   Specimen: Nasopharyngeal Swab  Result Value Ref Range Status   SARS Coronavirus 2 NEGATIVE NEGATIVE Final    Comment: (NOTE) SARS-CoV-2 target nucleic acids are NOT DETECTED.  The SARS-CoV-2 RNA is generally detectable in upper and lower respiratory specimens during the acute phase of infection. The lowest concentration of  SARS-CoV-2 viral copies this assay can detect is 250 copies / mL. A negative result does not preclude SARS-CoV-2 infection and should not be used as the sole basis for treatment or other patient management decisions.  A negative result may occur with improper specimen collection / handling, submission of specimen other than nasopharyngeal swab, presence of viral mutation(s) within the areas targeted by this assay, and inadequate number of viral copies (<250 copies / mL). A negative result must be combined with clinical observations, patient history, and epidemiological information.  Fact Sheet for Patients:   StrictlyIdeas.no  Fact Sheet for Healthcare Providers: BankingDealers.co.za  This test is not yet approved or  cleared by the [place] FDA and has been authorized for detection and/or diagnosis of SARS-CoV-2 by FDA under an Emergency Use Authorization (EUA).  This EUA will remain in effect (meaning this test can be used) for the duration of the COVID-19 declaration under Section 564(b)(1) of the Act, 21 U.S.C. section 360bbb-3(b)(1), unless the authorization is terminated or revoked sooner.  Performed at [facility], [address], [place], [place] [address]      Labs: Basic Metabolic Panel: Recent Labs  Lab 02/03/20 0048 02/03/20 0833 02/04/20 0631  NA 134*  --  137  K 3.7  --  3.9  CL 104  --  106  CO2 19*  --  21*  GLUCOSE 293*  --  125*  BUN 17  --  13  CREATININE 0.90  --  0.70  CALCIUM 8.5*  --  8.2*  MG  --  1.9  --   PHOS  --  4.5  --    Liver Function Tests: Recent Labs  Lab 02/03/20 0048  AST 16  ALT 18  ALKPHOS 95  BILITOT 1.0  PROT 6.6  ALBUMIN 3.7   CBC: Recent Labs  Lab 02/03/20 0048 02/04/20 0631  WBC 10.6* 13.1*  NEUTROABS 9.9*  --   HGB 14.1 13.7  HCT 43.6 43.6  MCV 90.6 94.2  PLT 161 142*    CBG: Recent Labs  Lab 02/05/20 0737 02/05/20 1112 02/05/20 1646 02/05/20 2228  02/06/20 0802  GLUCAP 172* 191* 198* 248* 198*    Signed:  Barton Dubois MD.  [facility] 02/06/2020, 11:26 AM

## 2020-02-06 NOTE — Plan of Care (Signed)

## 2020-02-08 ENCOUNTER — Ambulatory Visit (INDEPENDENT_AMBULATORY_CARE_PROVIDER_SITE_OTHER): Payer: No Typology Code available for payment source | Admitting: Urology

## 2020-02-08 ENCOUNTER — Other Ambulatory Visit: Payer: Self-pay

## 2020-02-08 ENCOUNTER — Encounter: Payer: Self-pay | Admitting: Urology

## 2020-02-08 VITALS — BP 173/94 | HR 71 | Temp 98.0°F | Ht 73.5 in | Wt 249.0 lb

## 2020-02-08 DIAGNOSIS — C61 Malignant neoplasm of prostate: Secondary | ICD-10-CM

## 2020-02-08 DIAGNOSIS — R972 Elevated prostate specific antigen [PSA]: Secondary | ICD-10-CM

## 2020-02-08 LAB — MICROSCOPIC EXAMINATION
RBC, Urine: >30 /hpf — AB (ref 0–2)
Renal Epithel, UA: NONE SEEN /hpf

## 2020-02-08 LAB — URINALYSIS, ROUTINE W REFLEX MICROSCOPIC
Bilirubin, UA: NEGATIVE
Ketones, UA: NEGATIVE
Leukocytes,UA: NEGATIVE
Nitrite, UA: NEGATIVE
Protein,UA: NEGATIVE
Specific Gravity, UA: 1.015 (ref 1.005–1.030)
Urobilinogen, Ur: 0.2 mg/dL (ref 0.2–1.0)
pH, UA: 5.5 (ref 5.0–7.5)

## 2020-02-08 LAB — CULTURE, BLOOD (ROUTINE X 2)
Culture: NO GROWTH
Special Requests: ADEQUATE

## 2020-02-08 NOTE — Patient Instructions (Signed)
Prostate Cancer  The prostate is a male gland that helps make semen. Prostate cancer is when abnormal cells grow in this gland. Follow these instructions at home:  Take over-the-counter and prescription medicines only as told by your doctor.  Eat a healthy diet.  Get plenty of sleep.  Ask your doctor for help to find a support group for men with prostate cancer.  Keep all follow-up visits as told by your doctor. This is important.  If you have to go to the hospital, let your cancer doctor (oncologist) know.  Touch, hold, hug, and caress your partner to continue to show sexual feelings. Contact a doctor if:  You have trouble peeing (urinating).  You have blood in your pee (urine).  You have pain in your hips, back, or chest. Get help right away if:  You have weakness in your legs.  You lose feeling (have numbness) in your legs.  You cannot control your pee or your poop (stool).  You have trouble breathing.  You have sudden pain in your chest.  You have chills or a fever. Summary  The prostate is a male gland that helps make semen. Prostate cancer is when abnormal cells grow in this gland.  Ask your doctor for help to find a support group for men with prostate cancer.  Contact a doctor if you have problems peeing or have any new pain that you did not have before. This information is not intended to replace advice given to you by your health care provider. Make sure you discuss any questions you have with your health care provider. Document Revised: 06/11/2017 Document Reviewed: 03/09/2016 Elsevier Patient Education  2020 [employer]  

## 2020-02-08 NOTE — Progress Notes (Signed)
02/08/2020 9:56 AM   [name] 1956/05/21 [id number]  Referring provider: [name], MD [address] [place],  [place] [address]  Prostate Cancer  HPI: [name] is a 64yo here for followup for prostate biopsy. Biopsy 3+4=7 in 1/12 cores. 20% of core. He developed sepsis after his prostate biopsy and was hospitalized  7/24-7/27  PMH: Past Medical History:  Diagnosis Date  . Anxiety   . Arthritis   . Diabetes mellitus without complication ([place])   . DVT (deep venous thrombosis) ([place])   . Hypercholesteremia   . Hypertension   . Reflux   . Sleep apnea     Surgical History: Past Surgical History:  Procedure Laterality Date  . ANKLE SURGERY Left   . BUNIONECTOMY    . HEEL SPUR SURGERY    . LIPOMA EXCISION    . SHOULDER SURGERY    . TONSILLECTOMY      Home Medications:  Allergies as of 02/08/2020      Reactions   Vicodin [hydrocodone-acetaminophen] Rash, Other (See Comments)   Sweating, Unconsciousness, fainting    Niacin Other (See Comments)   Warning- Niacin red skin and mild sweating   Niacin And Related    Rabeprazole       Medication List       Accurate as of February 08, 2020  9:56 AM. If you have any questions, ask your nurse or doctor.        acetaminophen 325 MG tablet Commonly known as: TYLENOL Take 2 tablets (650 mg total) by mouth every 6 (six) hours as needed for mild pain or headache (or Fever >/= 101).   aspirin EC 81 MG tablet Take 81 mg by mouth daily.   cefdinir 300 MG capsule Commonly known as: OMNICEF Take 1 capsule (300 mg total) by mouth every 12 (twelve) hours for 7 days.   CENTRUM SILVER ADULT 50+ PO Take 1 tablet by mouth every morning.   doxazosin 2 MG tablet Commonly known as: CARDURA Take 2 mg by mouth daily.   fish oil-omega-3 fatty acids 1000 MG capsule Take 1 g by mouth 2 (two) times daily.   fluticasone 50 MCG/ACT nasal spray Commonly known as: FLONASE Place into both nostrils daily as needed.   Jardiance  25 MG Tabs tablet Generic drug: empagliflozin Take 12.5 mg by mouth.   losartan 100 MG tablet Commonly known as: COZAAR Take 100 mg by mouth daily.   magnesium gluconate 500 MG tablet Commonly known as: MAGONATE Take 1,000 mg by mouth every morning.   Masks Misc 1 Device by Does not apply route at bedtime. Patient uses p10 mask when sleeping.   metFORMIN 1000 MG tablet Commonly known as: GLUCOPHAGE Take 1,000 mg by mouth 2 (two) times daily with a meal.   omeprazole 40 MG capsule Commonly known as: PRILOSEC Take 1 capsule (40 mg total) by mouth 2 (two) times daily.   rosuvastatin 10 MG tablet Commonly known as: CRESTOR Take 10 mg by mouth once a week. On sundays   vitamin C 500 MG tablet Commonly known as: ASCORBIC ACID Take 500 mg by mouth every morning.       Allergies:  Allergies  Allergen Reactions  . Vicodin [Hydrocodone-Acetaminophen] Rash and Other (See Comments)    Sweating, Unconsciousness, fainting   . Niacin Other (See Comments)    Warning- Niacin red skin and mild sweating  . Niacin And Related   . Rabeprazole     Family History: Family History  Problem Relation Age of Onset  . Healthy Mother   . Parkinson's disease Father     Social History:  reports that he has never smoked. He has never used smokeless tobacco. He reports that he does not drink alcohol and does not use drugs.  ROS: All other review of systems were reviewed and are negative except what is noted above in HPI  Physical Exam: BP (!) 173/94   Pulse 71   Temp 98 F (36.7 C)   Ht 6' 1.5" (1.867 m)   Wt (!) 249 lb (112.9 kg)   BMI 32.41 kg/m   Constitutional:  Alert and oriented, No acute distress. HEENT: [place] AT, moist mucus membranes.  Trachea midline, no masses. Cardiovascular: No clubbing, cyanosis, or edema. Respiratory: Normal respiratory effort, no increased work of breathing. GI: Abdomen is soft, nontender, nondistended, no abdominal masses GU: No CVA tenderness.   Lymph: No cervical or inguinal lymphadenopathy. Skin: No rashes, bruises or suspicious lesions. Neurologic: Grossly intact, no focal deficits, moving all 4 extremities. Psychiatric: Normal mood and affect.  Laboratory Data: Lab Results  Component Value Date   WBC 13.1 (H) 02/04/2020   HGB 13.7 02/04/2020   HCT 43.6 02/04/2020   MCV 94.2 02/04/2020   PLT 142 (L) 02/04/2020    Lab Results  Component Value Date   CREATININE 0.70 02/04/2020    No results found for: PSA  No results found for: TESTOSTERONE  Lab Results  Component Value Date   HGBA1C 7.9 (H) 02/03/2020    Urinalysis    Component Value Date/Time   COLORURINE YELLOW 02/03/2020 0124   APPEARANCEUR CLEAR 02/03/2020 0124   LABSPEC 1.031 (H) 02/03/2020 0124   PHURINE 5.0 02/03/2020 0124   GLUCOSEU >=500 (A) 02/03/2020 0124   HGBUR MODERATE (A) 02/03/2020 0124   BILIRUBINUR NEGATIVE 02/03/2020 0124   BILIRUBINUR neg 01/08/2020 1142   KETONESUR 5 (A) 02/03/2020 0124   PROTEINUR NEGATIVE 02/03/2020 0124   UROBILINOGEN 0.2 01/08/2020 1142   UROBILINOGEN 0.2 11/28/2012 1654   NITRITE NEGATIVE 02/03/2020 0124   LEUKOCYTESUR NEGATIVE 02/03/2020 0124    Lab Results  Component Value Date   BACTERIA RARE (A) 02/03/2020    Pertinent Imaging:  No results found for this or any previous visit.  Results for orders placed during the hospital encounter of 02/07/15  US Venous Img Lower Bilateral  Narrative CLINICAL DATA:  History of prior DVT involving the bilateral lower legs. Evaluate for acute or chronic DVT.  EXAM: BILATERAL LOWER EXTREMITY VENOUS DOPPLER ULTRASOUND  TECHNIQUE: Gray-scale sonography with graded compression, as well as color Doppler and duplex ultrasound were performed to evaluate the lower extremity deep venous systems from the level of the common femoral vein and including the common femoral, femoral, profunda femoral, popliteal and calf veins including the posterior tibial,  peroneal and gastrocnemius veins when visible. The superficial great saphenous vein was also interrogated. Spectral Doppler was utilized to evaluate flow at rest and with distal augmentation maneuvers in the common femoral, femoral and popliteal veins.  COMPARISON:  None.  FINDINGS: RIGHT LOWER EXTREMITY  Common Femoral Vein: No evidence of thrombus. Normal compressibility, respiratory phasicity and response to augmentation.  Saphenofemoral Junction: No evidence of thrombus. Normal compressibility and flow on color Doppler imaging.  Profunda Femoral Vein: No evidence of thrombus. Normal compressibility and flow on color Doppler imaging.  Femoral Vein: No evidence of thrombus. Normal compressibility, respiratory phasicity and response to augmentation.  Popliteal Vein: There is a very minimal amount of eccentric  nonocclusive hypoechoic wall thickening / chronic DVT within the right popliteal vein (image 24).  Calf Veins: No evidence of thrombus. Normal compressibility and flow on color Doppler imaging.  Superficial Great Saphenous Vein: No evidence of thrombus. Normal compressibility and flow on color Doppler imaging.  Venous Reflux:  None.  Other Findings:  None.  LEFT LOWER EXTREMITY  Common Femoral Vein: No evidence of thrombus. Normal compressibility, respiratory phasicity and response to augmentation.  Saphenofemoral Junction: No evidence of thrombus. Normal compressibility and flow on color Doppler imaging.  Profunda Femoral Vein: No evidence of thrombus. Normal compressibility and flow on color Doppler imaging.  Femoral Vein: No evidence of thrombus. Normal compressibility, respiratory phasicity and response to augmentation.  Popliteal Vein: No evidence of thrombus. Normal compressibility, respiratory phasicity and response to augmentation.  Calf Veins: No evidence of thrombus. Normal compressibility and flow on color Doppler imaging.  Superficial Great  Saphenous Vein: No evidence of thrombus. Normal compressibility and flow on color Doppler imaging.  Venous Reflux:  None.  Other Findings:  None.  IMPRESSION: 1. No evidence of acute or chronic DVT within the left lower extremity. 2. No evidence of acute DVT within the right lower extremity. 3. Suspected tiny amount of nonocclusive wall thickening/chronic DVT within the right popliteal vein.   Electronically Signed By: Sandi Mariscal M.D. On: 02/07/2015 16:47  No results found for this or any previous visit.  No results found for this or any previous visit.  No results found for this or any previous visit.  No results found for this or any previous visit.  No results found for this or any previous visit.  No results found for this or any previous visit.   Assessment & Plan:    1. . Prostate cancer ([place]) I discussed the natural history of intermediate prostate cancer with the patient and the various treatment options including active surveillance, RALP, IMRT, brachytherapy, cryotherapy, HIFU and ADT. After discussing the options the patient elects for RALP. He wishes to be referred to Dr. Barbaraann Share at [facility]    No follow-ups on file.  Nicolette Bang, MD  [facility] [place]

## 2020-02-08 NOTE — Progress Notes (Signed)
Urological Symptom Review  Patient is experiencing the following symptoms: Frequent urination Get up at night to urinate Urinary tract infection   Review of Systems  Gastrointestinal (upper)  : Negative for upper GI symptoms  Gastrointestinal (lower) : Negative for lower GI symptoms  Constitutional : Fever  Skin: Negative for skin symptoms  Eyes: Negative for eye symptoms  Ear/Nose/Throat : Negative for Ear/Nose/Throat symptoms  Hematologic/Lymphatic: Negative for Hematologic/Lymphatic symptoms  Cardiovascular : Negative for cardiovascular symptoms  Respiratory : Negative for respiratory symptoms  Endocrine: Negative for endocrine symptoms  Musculoskeletal: Negative for musculoskeletal symptoms  Neurological: Negative for neurological symptoms  Psychologic: Negative for psychiatric symptoms

## 2021-05-22 ENCOUNTER — Ambulatory Visit
Admission: EM | Admit: 2021-05-22 | Discharge: 2021-05-22 | Disposition: A | Discharge status: Home / Self Care | Payer: Non-veteran care | Attending: Family Medicine | Admitting: Family Medicine

## 2021-05-22 ENCOUNTER — Other Ambulatory Visit: Payer: Self-pay

## 2021-05-22 DIAGNOSIS — J0100 Acute maxillary sinusitis, unspecified: Secondary | ICD-10-CM

## 2021-05-22 DIAGNOSIS — R051 Acute cough: Secondary | ICD-10-CM

## 2021-05-22 DIAGNOSIS — Z20822 Contact with and (suspected) exposure to covid-19: Secondary | ICD-10-CM

## 2021-05-22 HISTORY — DX: Malignant (primary) neoplasm, unspecified: C80.1

## 2021-05-22 MED ORDER — BENZONATATE 100 MG PO CAPS: ORAL_CAPSULE | ORAL | 0 refills | Status: AC

## 2021-05-22 MED ORDER — AMOXICILLIN-POT CLAVULANATE 875-125 MG PO TABS: 1.0000 | ORAL_TABLET | Freq: Two times a day (BID) | ORAL | 0 refills | Status: AC

## 2021-05-22 NOTE — ED Provider Notes (Addendum)
[name]   [id number] 05/22/21 Arrival Time: 0801  ASSESSMENT & PLAN:  1. Acute non-recurrent maxillary sinusitis   2. Exposure to COVID-19 virus   3. Acute cough     Viral testing sent upon request. OTC symptom care as needed.  Begin: Meds ordered this encounter  Medications   amoxicillin-clavulanate (AUGMENTIN) 875-125 MG tablet    Sig: Take 1 tablet by mouth every 12 (twelve) hours.    Dispense:  20 tablet    Refill:  0   benzonatate (TESSALON) 100 MG capsule    Sig: Take 1 capsule by mouth every 8 (eight) hours for cough.    Dispense:  21 capsule    Refill:  0     Follow-up Information     Mark Noble, MD.   Specialty: Internal Medicine Why: As needed. Contact information: [address] [place] [address] [phone]                 Reviewed expectations re: course of current medical issues. Questions answered. Outlined signs and symptoms indicating need for more acute intervention. Understanding verbalized. After Visit Summary given.   SUBJECTIVE: History from: patient. [name] is a 65 y.o. male who reports: sinus pressure/congestion; > 2 w; past several days with cough also. Denies: fever. Normal PO intake without n/v/d. Reports COVID exposure 3-4 d ago.   OBJECTIVE:  Vitals:   05/22/21 0820  BP: 125/77  Pulse: 95  Resp: 18  Temp: 99.1 F (37.3 C)  TempSrc: Oral    General appearance: alert; no distress Eyes: PERRLA; EOMI; conjunctiva normal HENT: [place]; AT; with mild nasal congestion; max sinus TTP bilat Neck: supple  Lungs: speaks full sentences without difficulty; unlabored; clear Extremities: no edema Skin: warm and dry Neurologic: normal gait Psychological: alert and cooperative; normal mood and affect  Labs:  Labs Reviewed  COVID-19, FLU A+B NAA     Allergies  Allergen Reactions   Vicodin [Hydrocodone-Acetaminophen] Rash and Other (See Comments)    Sweating, Unconsciousness, fainting     Niacin Other (See Comments)    Warning- Niacin red skin and mild sweating   Niacin And Related    Rabeprazole     Past Medical History:  Diagnosis Date   Anxiety    Arthritis    Cancer ([place])    Diabetes mellitus without complication ([place])    DVT (deep venous thrombosis) ([facility])    Hypercholesteremia    Hypertension    Reflux    Sleep apnea    Social History   Socioeconomic History   Marital status: Married    Spouse name: Not on file   Number of children: 3   Years of education: Not on file   Highest education level: Not on file  Occupational History   Not on file  Tobacco Use   Smoking status: Never   Smokeless tobacco: Never  Substance and Sexual Activity   Alcohol use: No   Drug use: No   Sexual activity: Not on file  Other Topics Concern   Not on file  Social History Narrative   Not on file   Social Determinants of Health   Financial Resource Strain: Not on file  Food Insecurity: Not on file  Transportation Needs: Not on file  Physical Activity: Not on file  Stress: Not on file  Social Connections: Not on file  Intimate Partner Violence: Not on file   Family History  Problem Relation Age of Onset   Healthy Mother  Parkinson's disease Father    Past Surgical History:  Procedure Laterality Date   ANKLE SURGERY Left    BUNIONECTOMY     HEEL SPUR SURGERY     LIPOMA EXCISION     SHOULDER SURGERY     TONSILLECTOMY       Vanessa Kick, MD 05/22/21 1004    Vanessa Kick, MD 05/22/21 1005

## 2021-05-22 NOTE — ED Triage Notes (Signed)
Patient states he had his flu shot last Thursday. His wife and his daughter were diagnosed with Flu A last week and now he is congested with chest pain. He states he has a sore throat, sweating more than usual everything started 2 days ago.  States he has a diabetic nerve pain that is bothering him as well. Dr Willey Blade gave him a meclizine to break up his congestion but it is not working.  Denies Fever.

## 2021-05-23 LAB — COVID-19, FLU A+B NAA
Influenza A, NAA: DETECTED — AB
Influenza B, NAA: NOT DETECTED
SARS-CoV-2, NAA: NOT DETECTED

## 2023-10-06 ENCOUNTER — Ambulatory Visit (INDEPENDENT_AMBULATORY_CARE_PROVIDER_SITE_OTHER)

## 2023-10-06 ENCOUNTER — Ambulatory Visit: Admission: EM | Admit: 2023-10-06 | Discharge: 2023-10-06 | Disposition: A | Discharge status: Home / Self Care

## 2023-10-06 DIAGNOSIS — M25561 Pain in right knee: Secondary | ICD-10-CM

## 2023-10-06 MED ORDER — KETOROLAC TROMETHAMINE 30 MG/ML IJ SOLN
30.0000 mg | Freq: Once | INTRAMUSCULAR | Status: AC
  Administered 2023-10-06: 30 mg via INTRAMUSCULAR

## 2023-10-06 NOTE — ED Triage Notes (Signed)
 Pt reports right knee injury x 2 days ago. 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

## 2023-10-06 NOTE — Discharge Instructions (Addendum)
 We have given you a Toradol injection today to help with pain and inflammation of your knee.  You may continue rest, ice, compression, elevation, Tylenol as needed and follow-up with soon as possible with orthopedics if not resolving with these measures.  Will call if anything comes back abnormal on your knee x-ray.

## 2023-10-10 NOTE — ED Provider Notes (Signed)
 [facility]    CSN: [id number] Arrival date & time: 10/06/23  1101      History   Chief Complaint No chief complaint on file.   HPI [name] is a 68 y.o. male.   Presenting today with 2 day history of right knee pain after getting stuck in a muddy area causing him to twist his leg. Now having swelling and pain in the right knee and inner thigh area. Denies discoloration, numbness, tingling, loss of ROM. So far trying OTC remedies with minimal relief. Hx of arthritis.     Past Medical History:  Diagnosis Date   Anxiety    Arthritis    Cancer ([facility])    Diabetes mellitus without complication ([facility])    DVT (deep venous thrombosis) ([facility])    Hypercholesteremia    Hypertension    Reflux    Sleep apnea     Patient Active Problem List   Diagnosis Date Noted   Prostate cancer ([facility]) 02/08/2020   Sepsis ([facility]) 02/03/2020   HTN (hypertension) 02/03/2020   HLD (hyperlipidemia) 02/03/2020   BPH (benign prostatic hyperplasia) 02/03/2020   Elevated PSA 01/08/2020   Low back pain 07/12/2019   Pain in right hip 07/12/2019   Class 1 obesity due to excess calories with body mass index (BMI) of 32.0 to 32.9 in adult 07/12/2019    Past Surgical History:  Procedure Laterality Date   ANKLE SURGERY Left    BUNIONECTOMY     HEEL SPUR SURGERY     LIPOMA EXCISION     SHOULDER SURGERY     TONSILLECTOMY         Home Medications    Prior to Admission medications   Medication Sig Start Date End Date Taking? Authorizing Provider  glimepiride (AMARYL) 4 MG tablet Take 1 tablet by mouth every morning. 07/26/23  Yes [provider]  acetaminophen (TYLENOL) 325 MG tablet Take 2 tablets (650 mg total) by mouth every 6 (six) hours as needed for mild pain or headache (or Fever >/= 101). 02/06/20   Vassie Loll, MD  amoxicillin-clavulanate (AUGMENTIN) 875-125 MG tablet Take 1 tablet by mouth every 12 (twelve) hours. 05/22/21   Mardella Layman, MD  aspirin EC 81 MG tablet  Take 81 mg by mouth daily.    [provider]  benzonatate (TESSALON) 100 MG capsule Take 1 capsule by mouth every 8 (eight) hours for cough. 05/22/21   Mardella Layman, MD  doxazosin (CARDURA) 2 MG tablet Take 2 mg by mouth daily.    [provider]  fluticasone (FLONASE) 50 MCG/ACT nasal spray Place into both nostrils daily as needed.     [provider]  losartan (COZAAR) 100 MG tablet Take 100 mg by mouth daily.    [provider]  Masks MISC 1 Device by Does not apply route at bedtime. Patient uses p10 mask when sleeping.    [provider]  metFORMIN (GLUCOPHAGE) 1000 MG tablet Take 1,000 mg by mouth 2 (two) times daily with a meal.    [provider]  Multiple Vitamins-Minerals (CENTRUM SILVER ADULT 50+ PO) Take 1 tablet by mouth every morning.    [provider]  omeprazole (PRILOSEC) 40 MG capsule Take 1 capsule (40 mg total) by mouth 2 (two) times daily. 02/06/20   Vassie Loll, MD  rosuvastatin (CRESTOR) 10 MG tablet Take 10 mg by mouth once a week. On sundays    [provider]  Semaglutide,0.25 or 0.5MG /DOS, (OZEMPIC, 0.25 OR 0.5  MG/DOSE,) 2 MG/1.5ML SOPN Inject 0.5 mg into the skin.    [provider]  vitamin C (ASCORBIC ACID) 500 MG tablet Take 500 mg by mouth every morning.    [provider]    Family History Family History  Problem Relation Age of Onset   Healthy Mother    Parkinson's disease Father     Social History Social History   Tobacco Use   Smoking status: Never   Smokeless tobacco: Never  Substance Use Topics   Alcohol use: No   Drug use: No     Allergies   Vicodin [hydrocodone-acetaminophen], Empagliflozin, Niacin, Niacin and related, and Rabeprazole   Review of Systems Review of Systems PER HPI  Physical Exam Triage Vital Signs ED Triage Vitals  Encounter Vitals Group     BP 10/06/23 1110 (!) 156/80     Systolic BP Percentile --      Diastolic BP  Percentile --      Pulse Rate 10/06/23 1110 82     Resp 10/06/23 1110 18     Temp 10/06/23 1110 97.6 F (36.4 C)     Temp Source 10/06/23 1110 Oral     SpO2 10/06/23 1110 94 %     Weight --      Height --      Head Circumference --      Peak Flow --      Pain Score 10/06/23 1114 8     Pain Loc --      Pain Education --      Exclude from Growth Chart --    No data found.  Updated Vital Signs BP (!) 156/80 (BP Location: Right Arm)   Pulse 82   Temp 97.6 F (36.4 C) (Oral)   Resp 18   SpO2 94%   Visual Acuity Right Eye Distance:   Left Eye Distance:   Bilateral Distance:    Right Eye Near:   Left Eye Near:    Bilateral Near:     Physical Exam Vitals and nursing note reviewed.  Constitutional:      Appearance: Normal appearance.  HENT:     Head: Atraumatic.  Eyes:     Extraocular Movements: Extraocular movements intact.     Conjunctiva/sclera: Conjunctivae normal.  Cardiovascular:     Rate and Rhythm: Normal rate.  Pulmonary:     Effort: Pulmonary effort is normal.  Musculoskeletal:        General: Tenderness and signs of injury present. No swelling or deformity. Normal range of motion.     Cervical back: Normal range of motion and neck supple.     Comments: Diffuse ttp anteriorly right knee, ROM intact, neg drawer testing and mcmurrays right knee  Skin:    General: Skin is warm and dry.  Neurological:     Mental Status: He is oriented to person, place, and time.     Comments: RLE neurovascularly intact  Psychiatric:        Mood and Affect: Mood normal.        Thought Content: Thought content normal.        Judgment: Judgment normal.      UC Treatments / Results  Labs (all labs ordered are listed, but only abnormal results are displayed) Labs Reviewed - No data to display  EKG   Radiology No results found.  Procedures Procedures (including critical care time)  Medications Ordered in UC Medications  ketorolac (TORADOL) 30 MG/ML injection 30  mg (30 mg Intramuscular  Given 10/06/23 1141)    Initial Impression / Assessment and Plan / UC Course  I have reviewed the triage vital signs and the nursing notes.  Pertinent labs & imaging results that were available during my care of the patient were reviewed by me and considered in my medical decision making (see chart for details).     X-ray of right knee negative for acute bony abnormality, treat with IM toradol, RICE protocol, supportive home care. Return for worsening sxs.  Final Clinical Impressions(s) / UC Diagnoses   Final diagnoses:  Acute pain of right knee     Discharge Instructions      We have given you a Toradol injection today to help with pain and inflammation of your knee.  You may continue rest, ice, compression, elevation, Tylenol as needed and follow-up with soon as possible with orthopedics if not resolving with these measures.  Will call if anything comes back abnormal on your knee x-ray.    ED Prescriptions   None    PDMP not reviewed this encounter.   Particia Nearing, [place] 10/10/23 2223

## 2024-07-03 ENCOUNTER — Encounter (HOSPITAL_COMMUNITY): Payer: Self-pay

## 2024-07-05 ENCOUNTER — Encounter (HOSPITAL_COMMUNITY)
Admission: RE | Admit: 2024-07-05 | Discharge: 2024-07-05 | Disposition: A | Discharge status: Home / Self Care | Payer: Self-pay | Source: Ambulatory Visit | Attending: Cardiovascular Disease | Admitting: Cardiovascular Disease

## 2024-07-05 DIAGNOSIS — I25709 Atherosclerosis of coronary artery bypass graft(s), unspecified, with unspecified angina pectoris: Secondary | ICD-10-CM | POA: Insufficient documentation

## 2024-07-05 DIAGNOSIS — Z951 Presence of aortocoronary bypass graft: Secondary | ICD-10-CM | POA: Insufficient documentation

## 2024-07-05 DIAGNOSIS — I214 Non-ST elevation (NSTEMI) myocardial infarction: Secondary | ICD-10-CM | POA: Insufficient documentation

## 2024-07-05 NOTE — Progress Notes (Signed)
 Virtual orientation visit completed for cardiac rehab with CABGx3/NSTEMI. On-site orientation visit scheduled for 07/17/24 at 8am.

## 2024-07-17 ENCOUNTER — Encounter (HOSPITAL_COMMUNITY)
Admission: RE | Admit: 2024-07-17 | Discharge: 2024-07-17 | Disposition: A | Discharge status: Home / Self Care | Source: Ambulatory Visit | Attending: Cardiovascular Disease | Admitting: Cardiovascular Disease

## 2024-07-17 VITALS — Ht 73.0 in | Wt 269.4 lb

## 2024-07-17 DIAGNOSIS — Z951 Presence of aortocoronary bypass graft: Secondary | ICD-10-CM | POA: Insufficient documentation

## 2024-07-17 DIAGNOSIS — I25709 Atherosclerosis of coronary artery bypass graft(s), unspecified, with unspecified angina pectoris: Secondary | ICD-10-CM | POA: Insufficient documentation

## 2024-07-17 DIAGNOSIS — I214 Non-ST elevation (NSTEMI) myocardial infarction: Secondary | ICD-10-CM | POA: Insufficient documentation

## 2024-07-17 NOTE — Progress Notes (Signed)
 Cardiac Individual Treatment Plan  Patient Details  Name: [name] MRN: [id number] Date of Birth: 1955-11-27 Referring Provider:   Flowsheet Row CARDIAC REHAB PHASE II ORIENTATION from 07/17/2024 in [place] [facility]  Referring Provider Clarice Olszewski, MD    Initial Encounter Date:  Flowsheet Row CARDIAC REHAB PHASE II ORIENTATION from 07/17/2024 in [place] [facility]  Date 07/17/24    Visit Diagnosis: Atherosclerosis of coronary artery bypass graft of native heart with angina pectoris  S/P CABG x 3  NSTEMI (non-ST elevated myocardial infarction) (HCC)  Patient's Home Medications on Admission: Current Medications[1]  Past Medical History: Past Medical History:  Diagnosis Date   Anxiety    Arthritis    Cancer (HCC)    Diabetes mellitus without complication (HCC)    DVT (deep venous thrombosis) (HCC)    Hypercholesteremia    Hypertension    Reflux    Sleep apnea     Tobacco Use: Tobacco Use History[2]  Labs: Review Flowsheet       Latest Ref Rng & Units 02/03/2020 02/04/2020  Labs for ITP Cardiac and Pulmonary Rehab  Cholestrol 0 - 200 mg/dL - 854   LDL (calc) 0 - 99 mg/dL - 91   HDL-C >59 mg/dL - 28   Trlycerides <849 mg/dL - 871   Hemoglobin J8r 4.8 - 5.6 % 7.9  -     Exercise Target Goals: Exercise Program Goal: Individual exercise prescription set using results from initial 6 min walk test and THRR while considering  patients activity barriers and safety.   Exercise Prescription Goal: Initial exercise prescription builds to 30-45 minutes a day of aerobic activity, 2-3 days per week.  Home exercise guidelines will be given to patient during program as part of exercise prescription that the participant will acknowledge.   Education: Aerobic Exercise: - Group verbal and visual presentation on the components of exercise prescription. Introduces F.I.T.T principle from ACSM for exercise prescriptions.  Reviews F.I.T.T.  principles of aerobic exercise including progression. Written material provided at class time.   Education: Resistance Exercise: - Group verbal and visual presentation on the components of exercise prescription. Introduces F.I.T.T principle from ACSM for exercise prescriptions  Reviews F.I.T.T. principles of resistance exercise including progression. Written material provided at class time.    Education: Exercise & Equipment Safety: - Individual verbal instruction and demonstration of equipment use and safety with use of the equipment.   Education: Exercise Physiology & General Exercise Guidelines: - Group verbal and written instruction with models to review the exercise physiology of the cardiovascular system and associated critical values. Provides general exercise guidelines with specific guidelines to those with heart or lung disease. Written material provided at class time. Flowsheet Row CARDIAC VIRTUAL BASED CARE from 07/05/2024 in [place] [facility]  Education need identified 07/05/24    Education: Flexibility, Balance, Mind/Body Relaxation: - Group verbal and visual presentation with interactive activity on the components of exercise prescription. Introduces F.I.T.T principle from ACSM for exercise prescriptions. Reviews F.I.T.T. principles of flexibility and balance exercise training including progression. Also discusses the mind body connection.  Reviews various relaxation techniques to help reduce and manage stress (i.e. Deep breathing, progressive muscle relaxation, and visualization). Balance handout provided to take home. Written material provided at class time.   Activity Barriers & Risk Stratification:  Activity Barriers & Cardiac Risk Stratification - 07/05/24 1004       Activity Barriers & Cardiac Risk Stratification   Activity Barriers Back Problems;Shortness of Breath  Stenosis L4 and 5.   Cardiac Risk Stratification High          6 Minute Walk:   6 Minute Walk     Row Name 07/17/24 0915         6 Minute Walk   Phase Initial     Distance 1075 feet     Walk Time 6 minutes     # of Rest Breaks 0     MPH 2.04     METS 2.42     RPE 12     VO2 Peak 8.47     Symptoms No     Resting HR 79 bpm     Resting BP 118/80     Resting Oxygen Saturation  97 %     Exercise Oxygen Saturation  during 6 min walk 97 %     Max Ex. HR 106 bpm     Max Ex. BP 150/79     2 Minute Post BP 140/78        Oxygen Initial Assessment:   Oxygen Re-Evaluation:   Oxygen Discharge (Final Oxygen Re-Evaluation):   Initial Exercise Prescription:  Initial Exercise Prescription - 07/17/24 0900       Date of Initial Exercise RX and Referring Provider   Date 07/17/24    Referring Provider Clarice Olszewski, MD      Treadmill   MPH 1.6    Grade 0    Minutes 15    METs 2.23      REL-XR   Level 1    Speed 50    Minutes 15    METs 1.8      Prescription Details   Frequency (times per week) 3    Duration Progress to 30 minutes of continuous aerobic without signs/symptoms of physical distress      Intensity   THRR 40-80% of Max Heartrate 109-138    Ratings of Perceived Exertion 11-13    Perceived Dyspnea 0-4      Resistance Training   Training Prescription Yes    Weight 4    Reps 10-15          Perform Capillary Blood Glucose checks as needed.  Exercise Prescription Changes:   Exercise Prescription Changes     Row Name 07/17/24 0900             Response to Exercise   Blood Pressure (Admit) 118/80       Blood Pressure (Exercise) 150/76       Blood Pressure (Exit) 140/78       Heart Rate (Admit) 79 bpm       Heart Rate (Exercise) 106 bpm       Heart Rate (Exit) 85 bpm       Oxygen Saturation (Admit) 97 %       Oxygen Saturation (Exercise) 97 %       Oxygen Saturation (Exit) 97 %       Rating of Perceived Exertion (Exercise) 12       Perceived Dyspnea (Exercise) 1          Exercise Comments:   Exercise Goals and  Review:   Exercise Goals     Row Name 07/17/24 0920             Exercise Goals   Increase Physical Activity Yes       Intervention Provide advice, education, support and counseling about physical activity/exercise needs.;Develop an individualized exercise prescription for aerobic and resistive training based on initial  evaluation findings, risk stratification, comorbidities and participant's personal goals.       Expected Outcomes Short Term: Attend rehab on a regular basis to increase amount of physical activity.;Long Term: Add in home exercise to make exercise part of routine and to increase amount of physical activity.;Long Term: Exercising regularly at least 3-5 days a week.       Increase Strength and Stamina Yes       Intervention Provide advice, education, support and counseling about physical activity/exercise needs.;Develop an individualized exercise prescription for aerobic and resistive training based on initial evaluation findings, risk stratification, comorbidities and participant's personal goals.       Expected Outcomes Short Term: Increase workloads from initial exercise prescription for resistance, speed, and METs.;Short Term: Perform resistance training exercises routinely during rehab and add in resistance training at home;Long Term: Improve cardiorespiratory fitness, muscular endurance and strength as measured by increased METs and functional capacity ( )       Able to understand and use rate of perceived exertion (RPE) scale Yes       Intervention Provide education and explanation on how to use RPE scale       Expected Outcomes Short Term: Able to use RPE daily in rehab to express subjective intensity level;Long Term:  Able to use RPE to guide intensity level when exercising independently       Able to understand and use Dyspnea scale Yes       Intervention Provide education and explanation on how to use Dyspnea scale       Expected Outcomes Short Term: Able to use  Dyspnea scale daily in rehab to express subjective sense of shortness of breath during exertion;Long Term: Able to use Dyspnea scale to guide intensity level when exercising independently       Knowledge and understanding of Target Heart Rate Range (THRR) Yes       Intervention Provide education and explanation of THRR including how the numbers were predicted and where they are located for reference       Expected Outcomes Long Term: Able to use THRR to govern intensity when exercising independently;Short Term: Able to state/look up THRR;Short Term: Able to use daily as guideline for intensity in rehab       Able to check pulse independently Yes       Intervention Provide education and demonstration on how to check pulse in carotid and radial arteries.;Review the importance of being able to check your own pulse for safety during independent exercise       Expected Outcomes Short Term: Able to explain why pulse checking is important during independent exercise;Long Term: Able to check pulse independently and accurately       Understanding of Exercise Prescription Yes       Intervention Provide education, explanation, and written materials on patient's individual exercise prescription       Expected Outcomes Short Term: Able to explain program exercise prescription;Long Term: Able to explain home exercise prescription to exercise independently          Exercise Goals Re-Evaluation :   Discharge Exercise Prescription (Final Exercise Prescription Changes):  Exercise Prescription Changes - 07/17/24 0900       Response to Exercise   Blood Pressure (Admit) 118/80    Blood Pressure (Exercise) 150/76    Blood Pressure (Exit) 140/78    Heart Rate (Admit) 79 bpm    Heart Rate (Exercise) 106 bpm    Heart Rate (Exit) 85 bpm    Oxygen  Saturation (Admit) 97 %    Oxygen Saturation (Exercise) 97 %    Oxygen Saturation (Exit) 97 %    Rating of Perceived Exertion (Exercise) 12    Perceived Dyspnea  (Exercise) 1          Nutrition:  Target Goals: Understanding of nutrition guidelines, daily intake of sodium 1500mg , cholesterol 200mg , calories 30% from fat and 7% or less from saturated fats, daily to have 5 or more servings of fruits and vegetables.  Education: Nutrition 1 -Group instruction provided by verbal, written material, interactive activities, discussions, models, and posters to present general guidelines for heart healthy nutrition including macronutrients, label reading, and promoting whole foods over processed counterparts. Education serves as pensions consultant of discussion of heart healthy eating for all. Written material provided at class time. Flowsheet Row CARDIAC VIRTUAL BASED CARE from 07/05/2024 in [place] [facility]  Education need identified 07/05/24     Education: Nutrition 2 -Group instruction provided by verbal, written material, interactive activities, discussions, models, and posters to present general guidelines for heart healthy nutrition including sodium, cholesterol, and saturated fat. Providing guidance of habit forming to improve blood pressure, cholesterol, and body weight. Written material provided at class time. Flowsheet Row CARDIAC VIRTUAL BASED CARE from 07/05/2024 in [place] [facility]  Education need identified 07/05/24      Biometrics:  Pre Biometrics - 07/17/24 0920       Pre Biometrics   Height 6' 1 (1.854 m)    Weight 122.2 kg    Waist Circumference 48.5 inches    Hip Circumference 48 inches    Waist to Hip Ratio 1.01 %    BMI (Calculated) 35.55    Grip Strength 23.2 kg    Single Leg Stand 3.5 seconds           Nutrition Therapy Plan and Nutrition Goals:   Nutrition Assessments:  MEDIFICTS Score Key: >=70 Need to make dietary changes  40-70 Heart Healthy Diet <= 40 Therapeutic Level Cholesterol Diet  Flowsheet Row CARDIAC VIRTUAL BASED CARE from 07/05/2024 in [facility]  Picture Your Plate Total Score on Admission 61   Picture Your Plate Scores: <59 Unhealthy dietary pattern with much room for improvement. 41-50 Dietary pattern unlikely to meet recommendations for good health and room for improvement. 51-60 More healthful dietary pattern, with some room for improvement.  >60 Healthy dietary pattern, although there may be some specific behaviors that could be improved.    Nutrition Goals Re-Evaluation:   Nutrition Goals Discharge (Final Nutrition Goals Re-Evaluation):   Psychosocial: Target Goals: Acknowledge presence or absence of significant depression and/or stress, maximize coping skills, provide positive support system. Participant is able to verbalize types and ability to use techniques and skills needed for reducing stress and depression.   Education: Stress, Anxiety, and Depression - Group verbal and visual presentation to define topics covered.  Reviews how body is impacted by stress, anxiety, and depression.  Also discusses healthy ways to reduce stress and to treat/manage anxiety and depression. Written material provided at class time.   Education: Sleep Hygiene -Provides group verbal and written instruction about how sleep can affect your health.  Define sleep hygiene, discuss sleep cycles and impact of sleep habits. Review good sleep hygiene tips.   Initial Review & Psychosocial Screening:  Initial Psych Review & Screening - 07/05/24 1022       Initial Review   Current issues with None Identified      Family Dynamics  Good Support System? Yes    Comments Patient wife and children support him      Barriers   Psychosocial barriers to participate in program The patient should benefit from training in stress management and relaxation.;There are no identifiable barriers or psychosocial needs.      Screening Interventions   Interventions To provide support and resources with identified psychosocial needs;Encouraged to  exercise;Provide feedback about the scores to participant    Expected Outcomes Short Term goal: Utilizing psychosocial counselor, staff and physician to assist with identification of specific Stressors or current issues interfering with healing process. Setting desired goal for each stressor or current issue identified.;Long Term Goal: Stressors or current issues are controlled or eliminated.;Short Term goal: Identification and review with participant of any Quality of Life or Depression concerns found by scoring the questionnaire.;Long Term goal: The participant improves quality of Life and PHQ9 Scores as seen by post scores and/or verbalization of changes          Quality of Life Scores:   Quality of Life - 07/05/24 0921       Quality of Life   Select Quality of Life      Quality of Life Scores   Health/Function Pre 26.6 %    Socioeconomic Pre 27.19 %    Psych/Spiritual Pre 29.14 %    Family Pre 30 %    GLOBAL Pre 27.73 %         Scores of 19 and below usually indicate a poorer quality of life in these areas.  A difference of  2-3 points is a clinically meaningful difference.  A difference of 2-3 points in the total score of the Quality of Life Index has been associated with significant improvement in overall quality of life, self-image, physical symptoms, and general health in studies assessing change in quality of life.  PHQ-9: Review Flowsheet       07/17/2024 05/30/2018  Depression screen PHQ 2/9  Decreased Interest 0 0  Down, Depressed, Hopeless 0 0  PHQ - 2 Score 0 0  Altered sleeping 0 -  Tired, decreased energy 1 -  Change in appetite 0 -  Feeling bad or failure about yourself  0 -  Trouble concentrating 0 -  Moving slowly or fidgety/restless 0 -  Suicidal thoughts 0 -  PHQ-9 Score 1 -  Difficult doing work/chores Not difficult at all -   Interpretation of Total Score  Total Score Depression Severity:  1-4 = Minimal depression, 5-9 = Mild depression, 10-14 =  Moderate depression, 15-19 = Moderately severe depression, 20-27 = Severe depression   Psychosocial Evaluation and Intervention:  Psychosocial Evaluation - 07/05/24 1022       Psychosocial Evaluation & Interventions   Interventions Relaxation education;Stress management education;Encouraged to exercise with the program and follow exercise prescription    Comments Patient was referred to cardiac rehab with CABGx3 and NSTEMI from the [place]. He denies any depression or anxiety. He reports concern today regarding his vein harvest site with swelling, redness and pain. He has alreay called his surgeons office and is expecting a call from them. He reports sleeping okay but he wakes up some nights at 3am. His goals for the program are to increase his energy and endurance and to get back to his normal activities before his heart event. He recently retired and wants to be able to do more activities with his grandchildren. He has no barriers identified to complete the program.    Expected Outcomes We offer  2 educational sessions on heart healthy nutrition with handouts and offer assistance with RD referral if patient is interested.    Continue Psychosocial Services  Follow up required by staff          Psychosocial Re-Evaluation:   Psychosocial Discharge (Final Psychosocial Re-Evaluation):   Vocational Rehabilitation: Provide vocational rehab assistance to qualifying candidates.   Vocational Rehab Evaluation & Intervention:  Vocational Rehab - 07/05/24 1021       Initial Vocational Rehab Evaluation & Intervention   Assessment shows need for Vocational Rehabilitation No      Vocational Rehab Re-Evaulation   Comments Retired          Education: Education Goals: Education classes will be provided on a variety of topics geared toward better understanding of heart health and risk factor modification. Participant will state understanding/return demonstration of topics presented as noted by  education test scores.  Learning Barriers/Preferences:  Learning Barriers/Preferences - 07/05/24 1021       Learning Barriers/Preferences   Learning Barriers None    Learning Preferences Skilled Demonstration;Computer/Internet;Audio          General Cardiac Education Topics:  AED/CPR: - Group verbal and written instruction with the use of models to demonstrate the basic use of the AED with the basic ABC's of resuscitation.   Test and Procedures: - Group verbal and visual presentation and models provide information about basic cardiac anatomy and function. Reviews the testing methods done to diagnose heart disease and the outcomes of the test results. Describes the treatment choices: Medical Management, Angioplasty, or Coronary Bypass Surgery for treating various heart conditions including Myocardial Infarction, Angina, Valve Disease, and Cardiac Arrhythmias. Written material provided at class time.   Medication Safety: - Group verbal and visual instruction to review commonly prescribed medications for heart and lung disease. Reviews the medication, class of the drug, and side effects. Includes the steps to properly store meds and maintain the prescription regimen. Written material provided at class time.   Intimacy: - Group verbal instruction through game format to discuss how heart and lung disease can affect sexual intimacy. Written material provided at class time.   Know Your Numbers and Heart Failure: - Group verbal and visual instruction to discuss disease risk factors for cardiac and pulmonary disease and treatment options.  Reviews associated critical values for Overweight/Obesity, Hypertension, Cholesterol, and Diabetes.  Discusses basics of heart failure: signs/symptoms and treatments.  Introduces Heart Failure Zone chart for action plan for heart failure. Written material provided at class time.   Infection Prevention: - Provides verbal and written material to  individual with discussion of infection control including proper hand washing and proper equipment cleaning during exercise session.   Falls Prevention: - Provides verbal and written material to individual with discussion of falls prevention and safety.   Other: -Provides group and verbal instruction on various topics (see comments)   Knowledge Questionnaire Score:  Knowledge Questionnaire Score - 07/05/24 0920       Knowledge Questionnaire Score   Pre Score 21/26          Core Components/Risk Factors/Patient Goals at Admission:  Personal Goals and Risk Factors at Admission - 07/05/24 1021       Core Components/Risk Factors/Patient Goals on Admission    Weight Management Weight Maintenance    Improve shortness of breath with ADL's Yes    Intervention Provide education, individualized exercise plan and daily activity instruction to help decrease symptoms of SOB with activities of daily living.  Expected Outcomes Short Term: Improve cardiorespiratory fitness to achieve a reduction of symptoms when performing ADLs;Long Term: Be able to perform more ADLs without symptoms or delay the onset of symptoms    Diabetes Yes    Intervention Provide education about signs/symptoms and action to take for hypo/hyperglycemia.;Provide education about proper nutrition, including hydration, and aerobic/resistive exercise prescription along with prescribed medications to achieve blood glucose in normal ranges: Fasting glucose 65-99 mg/dL    Expected Outcomes Short Term: Participant verbalizes understanding of the signs/symptoms and immediate care of hyper/hypoglycemia, proper foot care and importance of medication, aerobic/resistive exercise and nutrition plan for blood glucose control.;Long Term: Attainment of HbA1C < 7%.    Hypertension Yes    Intervention Provide education on lifestyle modifcations including regular physical activity/exercise, weight management, moderate sodium restriction and  increased consumption of fresh fruit, vegetables, and low fat dairy, alcohol moderation, and smoking cessation.;Monitor prescription use compliance.    Expected Outcomes Short Term: Continued assessment and intervention until BP is < 140/9mm HG in hypertensive participants. < 130/54mm HG in hypertensive participants with diabetes, heart failure or chronic kidney disease.;Long Term: Maintenance of blood pressure at goal levels.    Lipids Yes    Intervention Provide education and support for participant on nutrition & aerobic/resistive exercise along with prescribed medications to achieve LDL 70mg , HDL >40mg .    Expected Outcomes Short Term: Participant states understanding of desired cholesterol values and is compliant with medications prescribed. Participant is following exercise prescription and nutrition guidelines.;Long Term: Cholesterol controlled with medications as prescribed, with individualized exercise RX and with personalized nutrition plan. Value goals: LDL < 70mg , HDL > 40 mg.          Education:Diabetes - Individual verbal and written instruction to review signs/symptoms of diabetes, desired ranges of glucose level fasting, after meals and with exercise. Acknowledge that pre and post exercise glucose checks will be done for 3 sessions at entry of program.   Core Components/Risk Factors/Patient Goals Review:    Core Components/Risk Factors/Patient Goals at Discharge (Final Review):    ITP Comments:  ITP Comments     Row Name 07/05/24 1027 07/17/24 0858         ITP Comments Virtual orientation visit completed for cardiac rehab with CABGx3/NSTEMI. On-site orientation visit scheduled for 07/17/24 at 8am. Patient arrived for 1st visit/orientation/education at 0800. Patient was referred to CR by Dr. Mindi Starks from the [facility] due to Atherosclerosis of coronary artery/CABGx3/NSTEMI. During orientation advised patient on arrival and appointment times what to wear, what to do before,  during and after exercise. Reviewed attendance and class policy.  Pt is scheduled to return Cardiac Rehab on 07/19/24 at 0745. Pt was advised to come to class 15 minutes before class starts.  Discussed RPE/Dpysnea scales. Patient participated in warm up stretches. Patient was able to complete 6 minute walk test.  Telemetry:NSR. Patient was measured for the equipment. Discussed equipment safety with patient. Took patient pre-anthropometric measurements. Patient finished visit at 0915.         Comments: Patient arrived for 1st visit/orientation/education at 0800. Patient was referred to CR by Dr. Mindi Starks from the [facility] due to Atherosclerosis of coronary artery/CABGx3/NSTEMI. During orientation advised patient on arrival and appointment times what to wear, what to do before, during and after exercise. Reviewed attendance and class policy.  Pt is scheduled to return Cardiac Rehab on 07/19/24 at 0745. Pt was advised to come to class 15 minutes before class starts.  Discussed RPE/Dpysnea scales. Patient  participated in warm up stretches. Patient was able to complete 6 minute walk test.  Telemetry:NSR. Patient was measured for the equipment. Discussed equipment safety with patient. Took patient pre-anthropometric measurements. Patient finished visit at 0915.      [1]  Current Outpatient Medications:    acetaminophen  (TYLENOL ) 325 MG tablet, Take 2 tablets (650 mg total) by mouth every 6 (six) hours as needed for mild pain or headache (or Fever >/= 101)., Disp: 40 tablet, Rfl: 0   amoxicillin -clavulanate (AUGMENTIN ) 875-125 MG tablet, Take 1 tablet by mouth every 12 (twelve) hours., Disp: 20 tablet, Rfl: 0   aspirin  EC 81 MG tablet, Take 81 mg by mouth daily., Disp: , Rfl:    benzonatate  (TESSALON ) 100 MG capsule, Take 1 capsule by mouth every 8 (eight) hours for cough., Disp: 21 capsule, Rfl: 0   blood glucose meter kit and supplies KIT, 1 each by Other route as directed., Disp: , Rfl:    clopidogrel  (PLAVIX) 75 MG tablet, Take 75 mg by mouth daily., Disp: , Rfl:    doxazosin  (CARDURA ) 2 MG tablet, Take 2 mg by mouth daily., Disp: , Rfl:    fluticasone  (FLONASE ) 50 MCG/ACT nasal spray, Place into both nostrils daily as needed. , Disp: , Rfl:    glimepiride (AMARYL) 4 MG tablet, Take 1 tablet by mouth every morning., Disp: , Rfl:    Insulin  Aspart FlexPen (NOVOLOG ) 100 UNIT/ML, Inject 18 Units into the skin as directed., Disp: , Rfl:    LANTUS SOLOSTAR 100 UNIT/ML Solostar Pen, Inject 34 Units into the skin as directed., Disp: , Rfl:    losartan (COZAAR) 100 MG tablet, Take 100 mg by mouth daily., Disp: , Rfl:    Masks MISC, 1 Device by Does not apply route at bedtime. Patient uses p10 mask when sleeping., Disp: , Rfl:    metFORMIN (GLUCOPHAGE) 1000 MG tablet, Take 1,000 mg by mouth 2 (two) times daily with a meal., Disp: , Rfl:    metoprolol tartrate (LOPRESSOR) 25 MG tablet, Take 25 mg by mouth 2 (two) times daily., Disp: , Rfl:    Multiple Vitamins-Minerals (CENTRUM SILVER ADULT 50+ PO), Take 1 tablet by mouth every morning., Disp: , Rfl:    omeprazole  (PRILOSEC) 40 MG capsule, Take 1 capsule (40 mg total) by mouth 2 (two) times daily., Disp: 60 capsule, Rfl: 1   omeprazole  (PRILOSEC) 40 MG capsule, Take 40 mg by mouth daily., Disp: , Rfl:    potassium chloride (MICRO-K) 10 MEQ CR capsule, Take 10 mEq by mouth 2 (two) times daily., Disp: , Rfl:    rosuvastatin  (CRESTOR ) 10 MG tablet, Take 10 mg by mouth once a week. On sundays, Disp: , Rfl:    Semaglutide,0.25 or 0.5MG /DOS, (OZEMPIC, 0.25 OR 0.5 MG/DOSE,) 2 MG/1.5ML SOPN, Inject 0.5 mg into the skin., Disp: , Rfl:    traMADol  (ULTRAM ) 50 MG tablet, Take 50 mg by mouth every 6 (six) hours as needed for severe pain (pain score 7-10)., Disp: , Rfl:    vitamin C (ASCORBIC ACID ) 500 MG tablet, Take 500 mg by mouth every morning., Disp: , Rfl:  [2]  Social History Tobacco Use  Smoking Status Never  Smokeless Tobacco Never

## 2024-07-17 NOTE — Patient Instructions (Signed)
 Patient Instructions  Patient Details  Name: [name] MRN: [id number] Date of Birth: 01-27-1956 Referring Provider:  Clarice Olszewski, MD  Below are your personal goals for exercise, nutrition, and risk factors. Our goal is to help you stay on track towards obtaining and maintaining these goals. We will be discussing your progress on these goals with you throughout the program.  Initial Exercise Prescription:  Initial Exercise Prescription - 07/17/24 0900       Date of Initial Exercise RX and Referring Provider   Date 07/17/24    Referring Provider Clarice Olszewski, MD      Treadmill   MPH 1.6    Grade 0    Minutes 15    METs 2.23      REL-XR   Level 1    Speed 50    Minutes 15    METs 1.8      Prescription Details   Frequency (times per week) 3    Duration Progress to 30 minutes of continuous aerobic without signs/symptoms of physical distress      Intensity   THRR 40-80% of Max Heartrate 109-138    Ratings of Perceived Exertion 11-13    Perceived Dyspnea 0-4      Resistance Training   Training Prescription Yes    Weight 4    Reps 10-15          Exercise Goals: Frequency: Be able to perform aerobic exercise two to three times per week in program working toward 2-5 days per week of home exercise.  Intensity: Work with a perceived exertion of 11 (fairly light) - 15 (hard) while following your exercise prescription.  We will make changes to your prescription with you as you progress through the program.   Duration: Be able to do 30 to 45 minutes of continuous aerobic exercise in addition to a 5 minute warm-up and a 5 minute cool-down routine.   Nutrition Goals: Your personal nutrition goals will be established when you do your nutrition analysis with the dietician.  The following are general nutrition guidelines to follow: Cholesterol < 200mg /day Sodium < 1500mg /day Fiber: Men over 50 yrs - 30 grams per day  Personal Goals:  Personal Goals and Risk  Factors at Admission - 07/05/24 1021       Core Components/Risk Factors/Patient Goals on Admission    Weight Management Weight Maintenance    Improve shortness of breath with ADL's Yes    Intervention Provide education, individualized exercise plan and daily activity instruction to help decrease symptoms of SOB with activities of daily living.    Expected Outcomes Short Term: Improve cardiorespiratory fitness to achieve a reduction of symptoms when performing ADLs;Long Term: Be able to perform more ADLs without symptoms or delay the onset of symptoms    Diabetes Yes    Intervention Provide education about signs/symptoms and action to take for hypo/hyperglycemia.;Provide education about proper nutrition, including hydration, and aerobic/resistive exercise prescription along with prescribed medications to achieve blood glucose in normal ranges: Fasting glucose 65-99 mg/dL    Expected Outcomes Short Term: Participant verbalizes understanding of the signs/symptoms and immediate care of hyper/hypoglycemia, proper foot care and importance of medication, aerobic/resistive exercise and nutrition plan for blood glucose control.;Long Term: Attainment of HbA1C < 7%.    Hypertension Yes    Intervention Provide education on lifestyle modifcations including regular physical activity/exercise, weight management, moderate sodium restriction and increased consumption of fresh fruit, vegetables, and low fat dairy, alcohol moderation, and smoking cessation.;Monitor prescription use  compliance.    Expected Outcomes Short Term: Continued assessment and intervention until BP is < 140/24mm HG in hypertensive participants. < 130/12mm HG in hypertensive participants with diabetes, heart failure or chronic kidney disease.;Long Term: Maintenance of blood pressure at goal levels.    Lipids Yes    Intervention Provide education and support for participant on nutrition & aerobic/resistive exercise along with prescribed medications  to achieve LDL 70mg , HDL >40mg .    Expected Outcomes Short Term: Participant states understanding of desired cholesterol values and is compliant with medications prescribed. Participant is following exercise prescription and nutrition guidelines.;Long Term: Cholesterol controlled with medications as prescribed, with individualized exercise RX and with personalized nutrition plan. Value goals: LDL < 70mg , HDL > 40 mg.          Exercise Goals and Review:  Exercise Goals     Row Name 07/17/24 0920             Exercise Goals   Increase Physical Activity Yes       Intervention Provide advice, education, support and counseling about physical activity/exercise needs.;Develop an individualized exercise prescription for aerobic and resistive training based on initial evaluation findings, risk stratification, comorbidities and participant's personal goals.       Expected Outcomes Short Term: Attend rehab on a regular basis to increase amount of physical activity.;Long Term: Add in home exercise to make exercise part of routine and to increase amount of physical activity.;Long Term: Exercising regularly at least 3-5 days a week.       Increase Strength and Stamina Yes       Intervention Provide advice, education, support and counseling about physical activity/exercise needs.;Develop an individualized exercise prescription for aerobic and resistive training based on initial evaluation findings, risk stratification, comorbidities and participant's personal goals.       Expected Outcomes Short Term: Increase workloads from initial exercise prescription for resistance, speed, and METs.;Short Term: Perform resistance training exercises routinely during rehab and add in resistance training at home;Long Term: Improve cardiorespiratory fitness, muscular endurance and strength as measured by increased METs and functional capacity ( )       Able to understand and use rate of perceived exertion (RPE) scale Yes        Intervention Provide education and explanation on how to use RPE scale       Expected Outcomes Short Term: Able to use RPE daily in rehab to express subjective intensity level;Long Term:  Able to use RPE to guide intensity level when exercising independently       Able to understand and use Dyspnea scale Yes       Intervention Provide education and explanation on how to use Dyspnea scale       Expected Outcomes Short Term: Able to use Dyspnea scale daily in rehab to express subjective sense of shortness of breath during exertion;Long Term: Able to use Dyspnea scale to guide intensity level when exercising independently       Knowledge and understanding of Target Heart Rate Range (THRR) Yes       Intervention Provide education and explanation of THRR including how the numbers were predicted and where they are located for reference       Expected Outcomes Long Term: Able to use THRR to govern intensity when exercising independently;Short Term: Able to state/look up THRR;Short Term: Able to use daily as guideline for intensity in rehab       Able to check pulse independently Yes  Intervention Provide education and demonstration on how to check pulse in carotid and radial arteries.;Review the importance of being able to check your own pulse for safety during independent exercise       Expected Outcomes Short Term: Able to explain why pulse checking is important during independent exercise;Long Term: Able to check pulse independently and accurately       Understanding of Exercise Prescription Yes       Intervention Provide education, explanation, and written materials on patient's individual exercise prescription       Expected Outcomes Short Term: Able to explain program exercise prescription;Long Term: Able to explain home exercise prescription to exercise independently          Copy of goals given to participant.

## 2024-07-19 ENCOUNTER — Encounter (HOSPITAL_COMMUNITY)
Admission: RE | Admit: 2024-07-19 | Discharge: 2024-07-19 | Disposition: A | Discharge status: Home / Self Care | Source: Ambulatory Visit

## 2024-07-19 DIAGNOSIS — I214 Non-ST elevation (NSTEMI) myocardial infarction: Secondary | ICD-10-CM

## 2024-07-19 DIAGNOSIS — Z951 Presence of aortocoronary bypass graft: Secondary | ICD-10-CM

## 2024-07-19 DIAGNOSIS — I25709 Atherosclerosis of coronary artery bypass graft(s), unspecified, with unspecified angina pectoris: Secondary | ICD-10-CM

## 2024-07-19 LAB — GLUCOSE, CAPILLARY: Glucose-Capillary: 119 mg/dL — ABNORMAL HIGH (ref 70–99)

## 2024-07-19 NOTE — Progress Notes (Signed)
 Daily Session Note  Patient Details  Name: [name] MRN: [id number] Date of Birth: [birth year] Referring Provider:   Flowsheet Row CARDIAC REHAB PHASE II ORIENTATION from 07/17/2024 in [facility] CARDIAC REHABILITATION  Referring Provider Clarice Olszewski, MD    Encounter Date: 07/19/2024  Check In:  Session Check In - 07/19/24 0758       Check-In   Supervising physician immediately available to respond to emergencies See telemetry face sheet for immediately available MD    Location [facility]    Staff Present Powell Benders, BS, Exercise Physiologist;Brittany Jackquline, BSN, RN, Rosalba Gelineau, MA, RCEP, CCRP, CCET    Virtual Visit No    Medication changes reported     No    Fall or balance concerns reported    No    Tobacco Cessation No Change    Warm-up and Cool-down Performed on first and last piece of equipment    Resistance Training Performed Yes    VAD Patient? No    PAD/SET Patient? No      Pain Assessment   Currently in Pain? No/denies    Multiple Pain Sites No          Capillary Blood Glucose: No results found for this or any previous visit (from the past 24 hours).    Tobacco Use History[1]  Goals Met:  Independence with exercise equipment Exercise tolerated well No report of concerns or symptoms today Strength training completed today  Goals Unmet:  Not Applicable  Comments: First full day of exercise!  Patient was oriented to gym and equipment including functions, settings, policies, and procedures.  Patient's individual exercise prescription and treatment plan were reviewed.  All starting workloads were established based on the results of the 6 minute walk test done at initial orientation visit.  The plan for exercise progression was also introduced and progression will be customized based on patient's performance and goals.        [1]  Social History Tobacco Use  Smoking Status Never  Smokeless Tobacco Never

## 2024-07-21 ENCOUNTER — Encounter (HOSPITAL_COMMUNITY)
Admission: RE | Admit: 2024-07-21 | Discharge: 2024-07-21 | Disposition: A | Source: Ambulatory Visit | Attending: Cardiovascular Disease | Admitting: Cardiovascular Disease

## 2024-07-21 DIAGNOSIS — I214 Non-ST elevation (NSTEMI) myocardial infarction: Secondary | ICD-10-CM

## 2024-07-21 DIAGNOSIS — Z951 Presence of aortocoronary bypass graft: Secondary | ICD-10-CM

## 2024-07-21 LAB — GLUCOSE, CAPILLARY
Glucose-Capillary: 144 mg/dL — ABNORMAL HIGH (ref 70–99)
Glucose-Capillary: 167 mg/dL — ABNORMAL HIGH (ref 70–99)

## 2024-07-21 NOTE — Progress Notes (Signed)
 Daily Session Note  Patient Details  Name: [name] MRN: [id number] Date of Birth: May 06, 1956 Referring Provider:   Flowsheet Row CARDIAC REHAB PHASE II ORIENTATION from 07/17/2024 in [facility] CARDIAC REHABILITATION  Referring Provider Clarice Olszewski, MD    Encounter Date: 07/21/2024  Check In:  Session Check In - 07/21/24 0757       Check-In   Supervising physician immediately available to respond to emergencies See telemetry face sheet for immediately available MD    Location [facility]    Staff Present Powell Benders, BS, Exercise Physiologist;Debra Vicci, RN, Randye Gelineau, MA, RCEP, CCRP, CCET    Virtual Visit No    Medication changes reported     No    Fall or balance concerns reported    No    Warm-up and Cool-down Performed on first and last piece of equipment    Resistance Training Performed Yes    VAD Patient? No    PAD/SET Patient? No      Pain Assessment   Currently in Pain? No/denies          Capillary Blood Glucose: No results found for this or any previous visit (from the past 24 hours).    Tobacco Use History[1]  Goals Met:  Independence with exercise equipment Exercise tolerated well No report of concerns or symptoms today Strength training completed today  Goals Unmet:  Not Applicable  Comments: Pt able to follow exercise prescription today without complaint.  Will continue to monitor for progression.        [1]  Social History Tobacco Use  Smoking Status Never  Smokeless Tobacco Never

## 2024-07-24 ENCOUNTER — Encounter (HOSPITAL_COMMUNITY)
Admission: RE | Admit: 2024-07-24 | Discharge: 2024-07-24 | Attending: Cardiovascular Disease | Admitting: Cardiovascular Disease

## 2024-07-24 DIAGNOSIS — I214 Non-ST elevation (NSTEMI) myocardial infarction: Secondary | ICD-10-CM

## 2024-07-24 DIAGNOSIS — Z951 Presence of aortocoronary bypass graft: Secondary | ICD-10-CM

## 2024-07-24 DIAGNOSIS — I25709 Atherosclerosis of coronary artery bypass graft(s), unspecified, with unspecified angina pectoris: Secondary | ICD-10-CM

## 2024-07-24 NOTE — Progress Notes (Signed)
 Daily Session Note  Patient Details  Name: [name] MRN: [id number] Date of Birth: [birth year] Referring Provider:   Flowsheet Row CARDIAC REHAB PHASE II ORIENTATION from 07/17/2024 in [facility] CARDIAC REHABILITATION  Referring Provider Clarice Olszewski, MD    Encounter Date: 07/24/2024  Check In:  Session Check In - 07/24/24 0758       Check-In   Supervising physician immediately available to respond to emergencies See telemetry face sheet for immediately available MD    Location [facility]    Staff Present Powell Benders, BS, Exercise Physiologist;Brittany Jackquline, BSN, RN, Rosalba Gelineau, MA, RCEP, CCRP, CCET    Virtual Visit No    Medication changes reported     No    Fall or balance concerns reported    No    Tobacco Cessation No Change    Warm-up and Cool-down Performed on first and last piece of equipment    Resistance Training Performed Yes    VAD Patient? No    PAD/SET Patient? No      Pain Assessment   Currently in Pain? No/denies    Multiple Pain Sites No          Capillary Blood Glucose: No results found for this or any previous visit (from the past 24 hours).    Tobacco Use History[1]  Goals Met:  Independence with exercise equipment Exercise tolerated well No report of concerns or symptoms today Strength training completed today  Goals Unmet:  Not Applicable  Comments: Pt able to follow exercise prescription today without complaint.  Will continue to monitor for progression.        [1]  Social History Tobacco Use  Smoking Status Never  Smokeless Tobacco Never

## 2024-07-26 ENCOUNTER — Encounter (HOSPITAL_COMMUNITY)
Admission: RE | Admit: 2024-07-26 | Discharge: 2024-07-26 | Attending: Cardiovascular Disease | Admitting: Cardiovascular Disease

## 2024-07-26 DIAGNOSIS — I214 Non-ST elevation (NSTEMI) myocardial infarction: Secondary | ICD-10-CM

## 2024-07-26 DIAGNOSIS — Z951 Presence of aortocoronary bypass graft: Secondary | ICD-10-CM

## 2024-07-26 NOTE — Progress Notes (Signed)
 Daily Session Note  Patient Details  Name: [name] MRN: [id number] Date of Birth: 17-Mar-1956 Referring Provider:   Flowsheet Row CARDIAC REHAB PHASE II ORIENTATION from 07/17/2024 in [facility] CARDIAC REHABILITATION  Referring Provider Clarice Olszewski, MD    Encounter Date: 07/26/2024  Check In:  Session Check In - 07/26/24 0759       Check-In   Supervising physician immediately available to respond to emergencies See telemetry face sheet for immediately available MD    Location [facility]    Staff Present Laymon Rattler, BSN, RN, WTA-C;Heather Con, BS, Exercise Physiologist    Virtual Visit No    Medication changes reported     No    Fall or balance concerns reported    No    Tobacco Cessation No Change    Warm-up and Cool-down Performed on first and last piece of equipment    Resistance Training Performed Yes    VAD Patient? No    PAD/SET Patient? No      Pain Assessment   Currently in Pain? No/denies          Capillary Blood Glucose: No results found for this or any previous visit (from the past 24 hours).    Tobacco Use History[1]  Goals Met:  Independence with exercise equipment Exercise tolerated well No report of concerns or symptoms today Strength training completed today  Goals Unmet:  Not Applicable  Comments: Pt able to follow exercise prescription today without complaint.  Will continue to monitor for progression.        [1]  Social History Tobacco Use  Smoking Status Never  Smokeless Tobacco Never

## 2024-07-28 ENCOUNTER — Encounter (HOSPITAL_COMMUNITY)
Admission: RE | Admit: 2024-07-28 | Discharge: 2024-07-28 | Disposition: A | Discharge status: Home / Self Care | Source: Ambulatory Visit | Attending: Cardiovascular Disease | Admitting: Cardiovascular Disease

## 2024-07-28 DIAGNOSIS — Z951 Presence of aortocoronary bypass graft: Secondary | ICD-10-CM

## 2024-07-28 DIAGNOSIS — I214 Non-ST elevation (NSTEMI) myocardial infarction: Secondary | ICD-10-CM

## 2024-07-28 NOTE — Progress Notes (Signed)
 Daily Session Note  Patient Details  Name: KYLLE LALL MRN: [id number] Date of Birth: [birth year] Referring Provider:   Flowsheet Row CARDIAC REHAB PHASE II ORIENTATION from 07/17/2024 in [facility] CARDIAC REHABILITATION  Referring Provider Clarice Olszewski, MD    Encounter Date: 07/28/2024  Check In:  Session Check In - 07/28/24 0751       Check-In   Supervising physician immediately available to respond to emergencies See telemetry face sheet for immediately available MD    Location [facility]    Staff Present Laymon Rattler, BSN, RN, Rosalba Gelineau, MA, RCEP, CCRP, CCET    Virtual Visit No    Medication changes reported     No    Fall or balance concerns reported    No    Tobacco Cessation No Change    Warm-up and Cool-down Performed on first and last piece of equipment    Resistance Training Performed Yes    VAD Patient? No    PAD/SET Patient? No      Pain Assessment   Currently in Pain? No/denies          Capillary Blood Glucose: No results found for this or any previous visit (from the past 24 hours).    Tobacco Use History[1]  Goals Met:  Independence with exercise equipment Exercise tolerated well No report of concerns or symptoms today Strength training completed today  Goals Unmet:  Not Applicable  Comments: Pt able to follow exercise prescription today without complaint.  Will continue to monitor for progression.        [1]  Social History Tobacco Use  Smoking Status Never  Smokeless Tobacco Never

## 2024-07-31 ENCOUNTER — Encounter (HOSPITAL_COMMUNITY)
Admission: RE | Admit: 2024-07-31 | Discharge: 2024-07-31 | Disposition: A | Source: Ambulatory Visit | Attending: Cardiovascular Disease | Admitting: Cardiovascular Disease

## 2024-07-31 DIAGNOSIS — I214 Non-ST elevation (NSTEMI) myocardial infarction: Secondary | ICD-10-CM

## 2024-07-31 DIAGNOSIS — Z951 Presence of aortocoronary bypass graft: Secondary | ICD-10-CM

## 2024-07-31 NOTE — Progress Notes (Signed)
 Daily Session Note  Patient Details  Name: [name] MRN: [id number] Date of Birth: Dec 03, 1955 Referring Provider:   Flowsheet Row CARDIAC REHAB PHASE II ORIENTATION from 07/17/2024 in [facility] CARDIAC REHABILITATION  Referring Provider Clarice Olszewski, MD    Encounter Date: 07/31/2024  Check In:  Session Check In - 07/31/24 0758       Check-In   Supervising physician immediately available to respond to emergencies See telemetry face sheet for immediately available MD    Location [facility]    Staff Present Laymon Rattler, BSN, RN, WTA-C;Heather Con, BS, Exercise Physiologist    Virtual Visit No    Medication changes reported     No    Fall or balance concerns reported    No    Tobacco Cessation No Change    Warm-up and Cool-down Performed on first and last piece of equipment    Resistance Training Performed Yes    VAD Patient? No    PAD/SET Patient? No      Pain Assessment   Currently in Pain? No/denies          Capillary Blood Glucose: No results found for this or any previous visit (from the past 24 hours).    Tobacco Use History[1]  Goals Met:  Independence with exercise equipment Exercise tolerated well No report of concerns or symptoms today Strength training completed today  Goals Unmet:  Not Applicable  Comments: Pt able to follow exercise prescription today without complaint.  Will continue to monitor for progression.        [1]  Social History Tobacco Use  Smoking Status Never  Smokeless Tobacco Never

## 2024-08-02 ENCOUNTER — Encounter (HOSPITAL_COMMUNITY)
Admission: RE | Admit: 2024-08-02 | Discharge: 2024-08-02 | Disposition: A | Source: Ambulatory Visit | Attending: Cardiovascular Disease | Admitting: Cardiovascular Disease

## 2024-08-02 DIAGNOSIS — I25709 Atherosclerosis of coronary artery bypass graft(s), unspecified, with unspecified angina pectoris: Secondary | ICD-10-CM

## 2024-08-02 DIAGNOSIS — Z951 Presence of aortocoronary bypass graft: Secondary | ICD-10-CM

## 2024-08-02 DIAGNOSIS — I214 Non-ST elevation (NSTEMI) myocardial infarction: Secondary | ICD-10-CM

## 2024-08-02 NOTE — Progress Notes (Signed)
 Daily Session Note  Patient Details  Name: [name] MRN: [id number] Date of Birth: 11-14-1955 Referring Provider:   Flowsheet Row CARDIAC REHAB PHASE II ORIENTATION from 07/17/2024 in [facility] CARDIAC REHABILITATION  Referring Provider Clarice Olszewski, MD    Encounter Date: 08/02/2024  Check In:  Session Check In - 08/02/24 0745       Check-In   Supervising physician immediately available to respond to emergencies See telemetry face sheet for immediately available MD    Location [facility]    Staff Present Richerd Buddle, RN;Hubert Derstine Vicci, RN, BSN;Heather Con, BS, Exercise Physiologist    Virtual Visit No    Medication changes reported     No    Fall or balance concerns reported    No    Warm-up and Cool-down Performed on first and last piece of equipment    Resistance Training Performed Yes    VAD Patient? No    PAD/SET Patient? No      Pain Assessment   Currently in Pain? No/denies    Multiple Pain Sites No          Capillary Blood Glucose: No results found for this or any previous visit (from the past 24 hours).    Tobacco Use History[1]  Goals Met:  Independence with exercise equipment Exercise tolerated well No report of concerns or symptoms today Strength training completed today  Goals Unmet:  Not Applicable  Comments: Pt able to follow exercise prescription today without complaint.  Will continue to monitor for progression.        [1]  Social History Tobacco Use  Smoking Status Never  Smokeless Tobacco Never

## 2024-08-04 ENCOUNTER — Encounter (HOSPITAL_COMMUNITY)

## 2024-08-07 ENCOUNTER — Encounter (HOSPITAL_COMMUNITY)

## 2024-08-08 ENCOUNTER — Encounter (HOSPITAL_COMMUNITY): Payer: Self-pay | Admitting: *Deleted

## 2024-08-08 DIAGNOSIS — Z951 Presence of aortocoronary bypass graft: Secondary | ICD-10-CM

## 2024-08-08 DIAGNOSIS — I214 Non-ST elevation (NSTEMI) myocardial infarction: Secondary | ICD-10-CM

## 2024-08-08 NOTE — Progress Notes (Signed)
 Cardiac Individual Treatment Plan  Patient Details  Name: [name] MRN: [id number] Date of Birth: 04-18-1956 Referring Provider:   Flowsheet Row CARDIAC REHAB PHASE II ORIENTATION from 07/17/2024 in [facility]  Referring Provider Clarice Olszewski, MD    Initial Encounter Date:  Flowsheet Row CARDIAC REHAB PHASE II ORIENTATION from 07/17/2024 in [facility]  Date 07/17/24    Visit Diagnosis: NSTEMI (non-ST elevated myocardial infarction) (HCC)  S/P CABG x 3  Patient's Home Medications on Admission: Current Medications[1]  Past Medical History: Past Medical History:  Diagnosis Date   Anxiety    Arthritis    Cancer (HCC)    Diabetes mellitus without complication (HCC)    DVT (deep venous thrombosis) (HCC)    Hypercholesteremia    Hypertension    Reflux    Sleep apnea     Tobacco Use: Tobacco Use History[2]  Labs: Review Flowsheet       Latest Ref Rng & Units 02/03/2020 02/04/2020  Labs for ITP Cardiac and Pulmonary Rehab  Cholestrol 0 - 200 mg/dL - 854   LDL (calc) 0 - 99 mg/dL - 91   HDL-C >59 mg/dL - 28   Trlycerides <849 mg/dL - 871   Hemoglobin J8r 4.8 - 5.6 % 7.9  -    Capillary Blood Glucose: Lab Results  Component Value Date   GLUCAP 167 (H) 07/21/2024   GLUCAP 144 (H) 07/21/2024   GLUCAP 119 (H) 07/19/2024   GLUCAP 222 (H) 02/06/2020   GLUCAP 198 (H) 02/06/2020     Exercise Target Goals: Exercise Program Goal: Individual exercise prescription set using results from initial 6 min walk test and THRR while considering  patients activity barriers and safety.   Exercise Prescription Goal: Starting with aerobic activity 30 plus minutes a day, 3 days per week for initial exercise prescription. Provide home exercise prescription and guidelines that participant acknowledges understanding prior to discharge.  Activity Barriers & Risk Stratification:  Activity Barriers & Cardiac Risk Stratification - 07/05/24  1004       Activity Barriers & Cardiac Risk Stratification   Activity Barriers Back Problems;Shortness of Breath   Stenosis L4 and 5.   Cardiac Risk Stratification High          6 Minute Walk:  6 Minute Walk     Row Name 07/17/24 0915         6 Minute Walk   Phase Initial     Distance 1075 feet     Walk Time 6 minutes     # of Rest Breaks 0     MPH 2.04     METS 2.42     RPE 12     VO2 Peak 8.47     Symptoms No     Resting HR 79 bpm     Resting BP 118/80     Resting Oxygen Saturation  97 %     Exercise Oxygen Saturation  during 6 min walk 97 %     Max Ex. HR 106 bpm     Max Ex. BP 150/79     2 Minute Post BP 140/78        Oxygen Initial Assessment:   Oxygen Re-Evaluation:   Oxygen Discharge (Final Oxygen Re-Evaluation):   Initial Exercise Prescription:  Initial Exercise Prescription - 07/17/24 0900       Date of Initial Exercise RX and Referring Provider   Date 07/17/24    Referring Provider Clarice Olszewski, MD  Treadmill   MPH 1.6    Grade 0    Minutes 15    METs 2.23      REL-XR   Level 1    Speed 50    Minutes 15    METs 1.8      Prescription Details   Frequency (times per week) 3    Duration Progress to 30 minutes of continuous aerobic without signs/symptoms of physical distress      Intensity   THRR 40-80% of Max Heartrate 109-138    Ratings of Perceived Exertion 11-13    Perceived Dyspnea 0-4      Resistance Training   Training Prescription Yes    Weight 4    Reps 10-15          Perform Capillary Blood Glucose checks as needed.  Exercise Prescription Changes:   Exercise Prescription Changes     Row Name 07/17/24 0900 07/19/24 1200 07/31/24 1500         Response to Exercise   Blood Pressure (Admit) 118/80 116/80 122/80     Blood Pressure (Exercise) 150/76 148/74 --     Blood Pressure (Exit) 140/78 134/66 158/80     Heart Rate (Admit) 79 bpm 86 bpm 110 bpm     Heart Rate (Exercise) 106 bpm 95 bpm 111 bpm      Heart Rate (Exit) 85 bpm 85 bpm 91 bpm     Oxygen Saturation (Admit) 97 % -- --     Oxygen Saturation (Exercise) 97 % -- --     Oxygen Saturation (Exit) 97 % -- --     Rating of Perceived Exertion (Exercise) 12 13 13      Perceived Dyspnea (Exercise) 1 1 --     Duration -- Continue with 30 min of aerobic exercise without signs/symptoms of physical distress. Continue with 30 min of aerobic exercise without signs/symptoms of physical distress.     Intensity -- THRR unchanged THRR unchanged       Progression   Progression -- Continue to progress workloads to maintain intensity without signs/symptoms of physical distress. Continue to progress workloads to maintain intensity without signs/symptoms of physical distress.       Resistance Training   Weight -- 4 4     Reps -- 10-15 10-15       Treadmill   MPH -- 1.5 2.5     Grade -- 0 1.5     Minutes -- 15 15     METs -- 2.15 3.43       REL-XR   Level -- 1 3     Speed -- 35 52     Minutes -- 15 15     METs -- 2.3 4.8        Exercise Comments:   Exercise Comments     Row Name 07/19/24 0800           Exercise Comments First full day of exercise!  Patient was oriented to gym and equipment including functions, settings, policies, and procedures.  Patient's individual exercise prescription and treatment plan were reviewed.  All starting workloads were established based on the results of the 6 minute walk test done at initial orientation visit.  The plan for exercise progression was also introduced and progression will be customized based on patient's performance and goals.          Exercise Goals and Review:   Exercise Goals     Row Name 07/17/24 [phone]  Exercise Goals   Increase Physical Activity Yes       Intervention Provide advice, education, support and counseling about physical activity/exercise needs.;Develop an individualized exercise prescription for aerobic and resistive training based on initial evaluation  findings, risk stratification, comorbidities and participant's personal goals.       Expected Outcomes Short Term: Attend rehab on a regular basis to increase amount of physical activity.;Long Term: Add in home exercise to make exercise part of routine and to increase amount of physical activity.;Long Term: Exercising regularly at least 3-5 days a week.       Increase Strength and Stamina Yes       Intervention Provide advice, education, support and counseling about physical activity/exercise needs.;Develop an individualized exercise prescription for aerobic and resistive training based on initial evaluation findings, risk stratification, comorbidities and participant's personal goals.       Expected Outcomes Short Term: Increase workloads from initial exercise prescription for resistance, speed, and METs.;Short Term: Perform resistance training exercises routinely during rehab and add in resistance training at home;Long Term: Improve cardiorespiratory fitness, muscular endurance and strength as measured by increased METs and functional capacity ( )       Able to understand and use rate of perceived exertion (RPE) scale Yes       Intervention Provide education and explanation on how to use RPE scale       Expected Outcomes Short Term: Able to use RPE daily in rehab to express subjective intensity level;Long Term:  Able to use RPE to guide intensity level when exercising independently       Able to understand and use Dyspnea scale Yes       Intervention Provide education and explanation on how to use Dyspnea scale       Expected Outcomes Short Term: Able to use Dyspnea scale daily in rehab to express subjective sense of shortness of breath during exertion;Long Term: Able to use Dyspnea scale to guide intensity level when exercising independently       Knowledge and understanding of Target Heart Rate Range (THRR) Yes       Intervention Provide education and explanation of THRR including how the numbers  were predicted and where they are located for reference       Expected Outcomes Long Term: Able to use THRR to govern intensity when exercising independently;Short Term: Able to state/look up THRR;Short Term: Able to use daily as guideline for intensity in rehab       Able to check pulse independently Yes       Intervention Provide education and demonstration on how to check pulse in carotid and radial arteries.;Review the importance of being able to check your own pulse for safety during independent exercise       Expected Outcomes Short Term: Able to explain why pulse checking is important during independent exercise;Long Term: Able to check pulse independently and accurately       Understanding of Exercise Prescription Yes       Intervention Provide education, explanation, and written materials on patient's individual exercise prescription       Expected Outcomes Short Term: Able to explain program exercise prescription;Long Term: Able to explain home exercise prescription to exercise independently          Exercise Goals Re-Evaluation :  Exercise Goals Re-Evaluation     Row Name 07/19/24 0800 07/19/24 1301           Exercise Goal Re-Evaluation   Exercise Goals Review Able to understand  and use rate of perceived exertion (RPE) scale;Knowledge and understanding of Target Heart Rate Range (THRR);Able to understand and use Dyspnea scale Increase Physical Activity;Increase Strength and Stamina;Understanding of Exercise Prescription      Comments Reviewed RPE and dyspnea scale, THR and program prescription with pt today.  Pt voiced understanding and was given a copy of goals to take home. [name] has his first day of rehab today. He did well and enjoyed coming      Expected Outcomes Short: Use RPE daily to regulate intensity.  Long: Follow program prescription in THR. Short continue to come to rehab   long: continue to exercise          Discharge Exercise Prescription (Final Exercise  Prescription Changes):  Exercise Prescription Changes - 07/31/24 1500       Response to Exercise   Blood Pressure (Admit) 122/80    Blood Pressure (Exit) 158/80    Heart Rate (Admit) 110 bpm    Heart Rate (Exercise) 111 bpm    Heart Rate (Exit) 91 bpm    Rating of Perceived Exertion (Exercise) 13    Duration Continue with 30 min of aerobic exercise without signs/symptoms of physical distress.    Intensity THRR unchanged      Progression   Progression Continue to progress workloads to maintain intensity without signs/symptoms of physical distress.      Resistance Training   Weight 4    Reps 10-15      Treadmill   MPH 2.5    Grade 1.5    Minutes 15    METs 3.43      REL-XR   Level 3    Speed 52    Minutes 15    METs 4.8          Nutrition:  Target Goals: Understanding of nutrition guidelines, daily intake of sodium 1500mg , cholesterol 200mg , calories 30% from fat and 7% or less from saturated fats, daily to have 5 or more servings of fruits and vegetables.  Biometrics:  Pre Biometrics - 07/17/24 0920       Pre Biometrics   Height 6' 1 (1.854 m)    Weight 269 lb 6.4 oz (122.2 kg)    Waist Circumference 48.5 inches    Hip Circumference 48 inches    Waist to Hip Ratio 1.01 %    BMI (Calculated) 35.55    Grip Strength 23.2 kg    Single Leg Stand 3.5 seconds           Nutrition Therapy Plan and Nutrition Goals:   Nutrition Assessments:  MEDIFICTS Score Key: >=70 Need to make dietary changes  40-70 Heart Healthy Diet <= 40 Therapeutic Level Cholesterol Diet  Flowsheet Row CARDIAC VIRTUAL BASED CARE from 07/05/2024 in [facility]  Picture Your Plate Total Score on Admission 61   Picture Your Plate Scores: <59 Unhealthy dietary pattern with much room for improvement. 41-50 Dietary pattern unlikely to meet recommendations for good health and room for improvement. 51-60 More healthful dietary pattern, with some room for  improvement.  >60 Healthy dietary pattern, although there may be some specific behaviors that could be improved.    Nutrition Goals Re-Evaluation:   Nutrition Goals Discharge (Final Nutrition Goals Re-Evaluation):   Psychosocial: Target Goals: Acknowledge presence or absence of significant depression and/or stress, maximize coping skills, provide positive support system. Participant is able to verbalize types and ability to use techniques and skills needed for reducing stress and depression.  Initial Review &  Psychosocial Screening:  Initial Psych Review & Screening - 07/05/24 1022       Initial Review   Current issues with None Identified      Family Dynamics   Good Support System? Yes    Comments Patient wife and children support him      Barriers   Psychosocial barriers to participate in program The patient should benefit from training in stress management and relaxation.;There are no identifiable barriers or psychosocial needs.      Screening Interventions   Interventions To provide support and resources with identified psychosocial needs;Encouraged to exercise;Provide feedback about the scores to participant    Expected Outcomes Short Term goal: Utilizing psychosocial counselor, staff and physician to assist with identification of specific Stressors or current issues interfering with healing process. Setting desired goal for each stressor or current issue identified.;Long Term Goal: Stressors or current issues are controlled or eliminated.;Short Term goal: Identification and review with participant of any Quality of Life or Depression concerns found by scoring the questionnaire.;Long Term goal: The participant improves quality of Life and PHQ9 Scores as seen by post scores and/or verbalization of changes          Quality of Life Scores:  Quality of Life - 07/05/24 0921       Quality of Life   Select Quality of Life      Quality of Life Scores   Health/Function Pre 26.6  %    Socioeconomic Pre 27.19 %    Psych/Spiritual Pre 29.14 %    Family Pre 30 %    GLOBAL Pre 27.73 %         Scores of 19 and below usually indicate a poorer quality of life in these areas.  A difference of  2-3 points is a clinically meaningful difference.  A difference of 2-3 points in the total score of the Quality of Life Index has been associated with significant improvement in overall quality of life, self-image, physical symptoms, and general health in studies assessing change in quality of life.  PHQ-9: Review Flowsheet       07/17/2024 05/30/2018  Depression screen PHQ 2/9  Decreased Interest 0 0  Down, Depressed, Hopeless 0 0  PHQ - 2 Score 0 0  Altered sleeping 0 -  Tired, decreased energy 1 -  Change in appetite 0 -  Feeling bad or failure about yourself  0 -  Trouble concentrating 0 -  Moving slowly or fidgety/restless 0 -  Suicidal thoughts 0 -  PHQ-9 Score 1 -  Difficult doing work/chores Not difficult at all -   Interpretation of Total Score  Total Score Depression Severity:  1-4 = Minimal depression, 5-9 = Mild depression, 10-14 = Moderate depression, 15-19 = Moderately severe depression, 20-27 = Severe depression   Psychosocial Evaluation and Intervention:  Psychosocial Evaluation - 07/05/24 1022       Psychosocial Evaluation & Interventions   Interventions Relaxation education;Stress management education;Encouraged to exercise with the program and follow exercise prescription    Comments Patient was referred to cardiac rehab with CABGx3 and NSTEMI from the [place]. He denies any depression or anxiety. He reports concern today regarding his vein harvest site with swelling, redness and pain. He has alreay called his surgeons office and is expecting a call from them. He reports sleeping okay but he wakes up some nights at 3am. His goals for the program are to increase his energy and endurance and to get back to his normal activities before his  heart event. He  recently retired and wants to be able to do more activities with his grandchildren. He has no barriers identified to complete the program.    Expected Outcomes We offer 2 educational sessions on heart healthy nutrition with handouts and offer assistance with RD referral if patient is interested.    Continue Psychosocial Services  Follow up required by staff          Psychosocial Re-Evaluation:   Psychosocial Discharge (Final Psychosocial Re-Evaluation):   Vocational Rehabilitation: Provide vocational rehab assistance to qualifying candidates.   Vocational Rehab Evaluation & Intervention:  Vocational Rehab - 07/05/24 1021       Initial Vocational Rehab Evaluation & Intervention   Assessment shows need for Vocational Rehabilitation No      Vocational Rehab Re-Evaulation   Comments Retired          Education: Education Goals: Education classes will be provided on a weekly basis, covering required topics. Participant will state understanding/return demonstration of topics presented.  Learning Barriers/Preferences:  Learning Barriers/Preferences - 07/05/24 1021       Learning Barriers/Preferences   Learning Barriers None    Learning Preferences Skilled Demonstration;Computer/Internet;Audio          Education Topics: Hypertension, Hypertension Reduction -Define heart disease and high blood pressure. Discus how high blood pressure affects the body and ways to reduce high blood pressure.   Exercise and Your Heart -Discuss why it is important to exercise, the FITT principles of exercise, normal and abnormal responses to exercise, and how to exercise safely.   Angina -Discuss definition of angina, causes of angina, treatment of angina, and how to decrease risk of having angina.   Cardiac Medications -Review what the following cardiac medications are used for, how they affect the body, and side effects that may occur when taking the medications.  Medications include  Aspirin , Beta blockers, calcium  channel blockers, ACE Inhibitors, angiotensin receptor blockers, diuretics, digoxin, and antihyperlipidemics.   Congestive Heart Failure -Discuss the definition of CHF, how to live with CHF, the signs and symptoms of CHF, and how keep track of weight and sodium intake.   Heart Disease and Intimacy -Discus the effect sexual activity has on the heart, how changes occur during intimacy as we age, and safety during sexual activity.   Smoking Cessation / COPD -Discuss different methods to quit smoking, the health benefits of quitting smoking, and the definition of COPD.   Nutrition I: Fats -Discuss the types of cholesterol, what cholesterol does to the heart, and how cholesterol levels can be controlled.   Nutrition II: Labels -Discuss the different components of food labels and how to read food label   Heart Parts/Heart Disease and PAD -Discuss the anatomy of the heart, the pathway of blood circulation through the heart, and these are affected by heart disease.   Stress I: Signs and Symptoms -Discuss the causes of stress, how stress may lead to anxiety and depression, and ways to limit stress.   Stress II: Relaxation -Discuss different types of relaxation techniques to limit stress.   Warning Signs of Stroke / TIA -Discuss definition of a stroke, what the signs and symptoms are of a stroke, and how to identify when someone is having stroke.   Knowledge Questionnaire Score:  Knowledge Questionnaire Score - 07/05/24 0920       Knowledge Questionnaire Score   Pre Score 21/26          Core Components/Risk Factors/Patient Goals at Admission:  Personal Goals and Risk  Factors at Admission - 07/05/24 1021       Core Components/Risk Factors/Patient Goals on Admission    Weight Management Weight Maintenance    Improve shortness of breath with ADL's Yes    Intervention Provide education, individualized exercise plan and daily activity  instruction to help decrease symptoms of SOB with activities of daily living.    Expected Outcomes Short Term: Improve cardiorespiratory fitness to achieve a reduction of symptoms when performing ADLs;Long Term: Be able to perform more ADLs without symptoms or delay the onset of symptoms    Diabetes Yes    Intervention Provide education about signs/symptoms and action to take for hypo/hyperglycemia.;Provide education about proper nutrition, including hydration, and aerobic/resistive exercise prescription along with prescribed medications to achieve blood glucose in normal ranges: Fasting glucose 65-99 mg/dL    Expected Outcomes Short Term: Participant verbalizes understanding of the signs/symptoms and immediate care of hyper/hypoglycemia, proper foot care and importance of medication, aerobic/resistive exercise and nutrition plan for blood glucose control.;Long Term: Attainment of HbA1C < 7%.    Hypertension Yes    Intervention Provide education on lifestyle modifcations including regular physical activity/exercise, weight management, moderate sodium restriction and increased consumption of fresh fruit, vegetables, and low fat dairy, alcohol moderation, and smoking cessation.;Monitor prescription use compliance.    Expected Outcomes Short Term: Continued assessment and intervention until BP is < 140/78mm HG in hypertensive participants. < 130/25mm HG in hypertensive participants with diabetes, heart failure or chronic kidney disease.;Long Term: Maintenance of blood pressure at goal levels.    Lipids Yes    Intervention Provide education and support for participant on nutrition & aerobic/resistive exercise along with prescribed medications to achieve LDL 70mg , HDL >40mg .    Expected Outcomes Short Term: Participant states understanding of desired cholesterol values and is compliant with medications prescribed. Participant is following exercise prescription and nutrition guidelines.;Long Term: Cholesterol  controlled with medications as prescribed, with individualized exercise RX and with personalized nutrition plan. Value goals: LDL < 70mg , HDL > 40 mg.          Core Components/Risk Factors/Patient Goals Review:    Core Components/Risk Factors/Patient Goals at Discharge (Final Review):    ITP Comments:  ITP Comments     Row Name 07/05/24 1027 07/17/24 0858 07/19/24 0800 08/08/24 0938     ITP Comments Virtual orientation visit completed for cardiac rehab with CABGx3/NSTEMI. On-site orientation visit scheduled for 07/17/24 at 8am. Patient arrived for 1st visit/orientation/education at 0800. Patient was referred to CR by Dr. Mindi Starks from the [facility] due to Atherosclerosis of coronary artery/CABGx3/NSTEMI. During orientation advised patient on arrival and appointment times what to wear, what to do before, during and after exercise. Reviewed attendance and class policy.  Pt is scheduled to return Cardiac Rehab on 07/19/24 at 0745. Pt was advised to come to class 15 minutes before class starts.  Discussed RPE/Dpysnea scales. Patient participated in warm up stretches. Patient was able to complete 6 minute walk test.  Telemetry:NSR. Patient was measured for the equipment. Discussed equipment safety with patient. Took patient pre-anthropometric measurements. Patient finished visit at 0915. First full day of exercise!  Patient was oriented to gym and equipment including functions, settings, policies, and procedures.  Patient's individual exercise prescription and treatment plan were reviewed.  All starting workloads were established based on the results of the 6 minute walk test done at initial orientation visit.  The plan for exercise progression was also introduced and progression will be customized based on patient's performance and  goals. 30 day review completed. ITP sent to Dr. Dorn Ross, Medical Director of Cardiac Rehab. Continue with ITP unless changes are made by physician.  Newer to program        Comments: 30 day review     [1]  Current Outpatient Medications:    acetaminophen  (TYLENOL ) 325 MG tablet, Take 2 tablets (650 mg total) by mouth every 6 (six) hours as needed for mild pain or headache (or Fever >/= 101)., Disp: 40 tablet, Rfl: 0   amoxicillin -clavulanate (AUGMENTIN ) 875-125 MG tablet, Take 1 tablet by mouth every 12 (twelve) hours., Disp: 20 tablet, Rfl: 0   aspirin  EC 81 MG tablet, Take 81 mg by mouth daily., Disp: , Rfl:    benzonatate  (TESSALON ) 100 MG capsule, Take 1 capsule by mouth every 8 (eight) hours for cough., Disp: 21 capsule, Rfl: 0   blood glucose meter kit and supplies KIT, 1 each by Other route as directed., Disp: , Rfl:    clopidogrel (PLAVIX) 75 MG tablet, Take 75 mg by mouth daily., Disp: , Rfl:    doxazosin  (CARDURA ) 2 MG tablet, Take 2 mg by mouth daily., Disp: , Rfl:    fluticasone  (FLONASE ) 50 MCG/ACT nasal spray, Place into both nostrils daily as needed. , Disp: , Rfl:    glimepiride (AMARYL) 4 MG tablet, Take 1 tablet by mouth every morning., Disp: , Rfl:    Insulin  Aspart FlexPen (NOVOLOG ) 100 UNIT/ML, Inject 18 Units into the skin as directed., Disp: , Rfl:    LANTUS SOLOSTAR 100 UNIT/ML Solostar Pen, Inject 34 Units into the skin as directed., Disp: , Rfl:    losartan (COZAAR) 100 MG tablet, Take 100 mg by mouth daily., Disp: , Rfl:    Masks MISC, 1 Device by Does not apply route at bedtime. Patient uses p10 mask when sleeping., Disp: , Rfl:    metFORMIN (GLUCOPHAGE) 1000 MG tablet, Take 1,000 mg by mouth 2 (two) times daily with a meal., Disp: , Rfl:    metoprolol tartrate (LOPRESSOR) 25 MG tablet, Take 25 mg by mouth 2 (two) times daily., Disp: , Rfl:    Multiple Vitamins-Minerals (CENTRUM SILVER ADULT 50+ PO), Take 1 tablet by mouth every morning., Disp: , Rfl:    omeprazole  (PRILOSEC) 40 MG capsule, Take 1 capsule (40 mg total) by mouth 2 (two) times daily., Disp: 60 capsule, Rfl: 1   omeprazole  (PRILOSEC) 40 MG capsule, Take 40 mg by  mouth daily., Disp: , Rfl:    potassium chloride (MICRO-K) 10 MEQ CR capsule, Take 10 mEq by mouth 2 (two) times daily., Disp: , Rfl:    rosuvastatin  (CRESTOR ) 10 MG tablet, Take 10 mg by mouth once a week. On sundays, Disp: , Rfl:    Semaglutide,0.25 or 0.5MG /DOS, (OZEMPIC, 0.25 OR 0.5 MG/DOSE,) 2 MG/1.5ML SOPN, Inject 0.5 mg into the skin., Disp: , Rfl:    traMADol  (ULTRAM ) 50 MG tablet, Take 50 mg by mouth every 6 (six) hours as needed for severe pain (pain score 7-10)., Disp: , Rfl:    vitamin C (ASCORBIC ACID ) 500 MG tablet, Take 500 mg by mouth every morning., Disp: , Rfl:  [2]  Social History Tobacco Use  Smoking Status Never  Smokeless Tobacco Never

## 2024-08-09 ENCOUNTER — Encounter (HOSPITAL_COMMUNITY)

## 2024-08-11 ENCOUNTER — Encounter (HOSPITAL_COMMUNITY)
Admission: RE | Admit: 2024-08-11 | Discharge: 2024-08-11 | Disposition: A | Source: Ambulatory Visit | Attending: Cardiovascular Disease | Admitting: Cardiovascular Disease

## 2024-08-11 DIAGNOSIS — I214 Non-ST elevation (NSTEMI) myocardial infarction: Secondary | ICD-10-CM

## 2024-08-11 DIAGNOSIS — I25709 Atherosclerosis of coronary artery bypass graft(s), unspecified, with unspecified angina pectoris: Secondary | ICD-10-CM

## 2024-08-11 DIAGNOSIS — Z951 Presence of aortocoronary bypass graft: Secondary | ICD-10-CM

## 2024-08-11 NOTE — Progress Notes (Signed)
 Daily Session Note  Patient Details  Name: [name] MRN: [id number] Date of Birth: 08-10-1955 Referring Provider:   Flowsheet Row CARDIAC REHAB PHASE II ORIENTATION from 07/17/2024 in [facility] CARDIAC REHABILITATION  Referring Provider Clarice Olszewski, MD    Encounter Date: 08/11/2024  Check In:  Session Check In - 08/11/24 0758       Check-In   Supervising physician immediately available to respond to emergencies See telemetry face sheet for immediately available MD    Location [facility]    Staff Present Powell Benders, BS, Exercise Physiologist;Jessica Vonzell, MA, RCEP, CCRP, CCET;Victoria [place], RN    Virtual Visit No    Medication changes reported     No    Fall or balance concerns reported    No    Tobacco Cessation No Change    Warm-up and Cool-down Performed on first and last piece of equipment    Resistance Training Performed Yes    VAD Patient? No    PAD/SET Patient? No      Pain Assessment   Currently in Pain? No/denies    Multiple Pain Sites No          Capillary Blood Glucose: No results found for this or any previous visit (from the past 24 hours).    Tobacco Use History[1]  Goals Met:  Independence with exercise equipment Exercise tolerated well No report of concerns or symptoms today Strength training completed today  Goals Unmet:  Not Applicable  Comments: Pt able to follow exercise prescription today without complaint.  Will continue to monitor for progression.        [1]  Social History Tobacco Use  Smoking Status Never  Smokeless Tobacco Never

## 2024-08-14 ENCOUNTER — Encounter (HOSPITAL_COMMUNITY)

## 2024-08-16 ENCOUNTER — Encounter (HOSPITAL_COMMUNITY): Admission: RE | Admit: 2024-08-16 | Discharge: 2024-08-16 | Disposition: A | Source: Ambulatory Visit

## 2024-08-16 DIAGNOSIS — Z951 Presence of aortocoronary bypass graft: Secondary | ICD-10-CM

## 2024-08-16 DIAGNOSIS — I25709 Atherosclerosis of coronary artery bypass graft(s), unspecified, with unspecified angina pectoris: Secondary | ICD-10-CM

## 2024-08-16 DIAGNOSIS — I214 Non-ST elevation (NSTEMI) myocardial infarction: Secondary | ICD-10-CM

## 2024-08-16 NOTE — Progress Notes (Signed)
 Daily Session Note  Patient Details  Name: [name] MRN: [id number] Date of Birth: January 23, 1956 Referring Provider:   Flowsheet Row CARDIAC REHAB PHASE II ORIENTATION from 07/17/2024 in [facility] CARDIAC REHABILITATION  Referring Provider Clarice Olszewski, MD    Encounter Date: 08/16/2024  Check In:  Session Check In - 08/16/24 0743       Check-In   Supervising physician immediately available to respond to emergencies See telemetry face sheet for immediately available MD    Location [facility]    Staff Present Laymon Rattler, BSN, RN, Rosalba Gelineau, MA, RCEP, CCRP, CCET;Heather [place], [place], Exercise Physiologist    Virtual Visit No    Medication changes reported     No    Fall or balance concerns reported    No    Tobacco Cessation No Change    Warm-up and Cool-down Performed on first and last piece of equipment    Resistance Training Performed Yes    VAD Patient? No    PAD/SET Patient? No      Pain Assessment   Currently in Pain? No/denies          Capillary Blood Glucose: No results found for this or any previous visit (from the past 24 hours).    Tobacco Use History[1]  Goals Met:  Independence with exercise equipment Exercise tolerated well No report of concerns or symptoms today Strength training completed today  Goals Unmet:  Not Applicable  Comments: Pt able to follow exercise prescription today without complaint.  Will continue to monitor for progression.        [1]  Social History Tobacco Use  Smoking Status Never  Smokeless Tobacco Never

## 2024-08-18 ENCOUNTER — Encounter (HOSPITAL_COMMUNITY): Admission: RE | Admit: 2024-08-18 | Source: Ambulatory Visit

## 2024-08-18 NOTE — Progress Notes (Signed)
 Daily Session Note  Patient Details  Name: [name] MRN: [id number] Date of Birth: [birth year] Referring Provider:   Flowsheet Row CARDIAC REHAB PHASE II ORIENTATION from 07/17/2024 in [facility] CARDIAC REHABILITATION  Referring Provider Clarice Olszewski, MD    Encounter Date: 08/18/2024  Check In:  Session Check In - 08/18/24 9191       Check-In   Supervising physician immediately available to respond to emergencies See telemetry face sheet for immediately available MD    Location [facility]    Staff Present Powell Benders, BS, Exercise Physiologist;Brittany Jackquline, BSN, RN, Rosalba Gelineau, MA, RCEP, CCRP, CCET;Victoria [place], RN    Virtual Visit No    Medication changes reported     No    Fall or balance concerns reported    No    Tobacco Cessation No Change    Warm-up and Cool-down Performed on first and last piece of equipment    Resistance Training Performed Yes    VAD Patient? No    PAD/SET Patient? No      Pain Assessment   Currently in Pain? No/denies    Multiple Pain Sites No          Capillary Blood Glucose: No results found for this or any previous visit (from the past 24 hours).    Tobacco Use History[1]  Goals Met:  Independence with exercise equipment Exercise tolerated well No report of concerns or symptoms today Strength training completed today  Goals Unmet:  Not Applicable  Comments: Pt able to follow exercise prescription today without complaint.  Will continue to monitor for progression.        [1]  Social History Tobacco Use  Smoking Status Never  Smokeless Tobacco Never

## 2024-08-21 ENCOUNTER — Encounter (HOSPITAL_COMMUNITY)

## 2024-08-23 ENCOUNTER — Encounter (HOSPITAL_COMMUNITY)

## 2024-08-25 ENCOUNTER — Encounter (HOSPITAL_COMMUNITY)

## 2024-08-28 ENCOUNTER — Encounter (HOSPITAL_COMMUNITY)

## 2024-08-30 ENCOUNTER — Encounter (HOSPITAL_COMMUNITY)

## 2024-09-01 ENCOUNTER — Encounter (HOSPITAL_COMMUNITY)

## 2024-09-04 ENCOUNTER — Encounter (HOSPITAL_COMMUNITY)

## 2024-09-06 ENCOUNTER — Encounter (HOSPITAL_COMMUNITY)

## 2024-09-08 ENCOUNTER — Encounter (HOSPITAL_COMMUNITY)

## 2024-09-11 ENCOUNTER — Encounter (HOSPITAL_COMMUNITY)

## 2024-09-13 ENCOUNTER — Encounter (HOSPITAL_COMMUNITY)

## 2024-09-15 ENCOUNTER — Encounter (HOSPITAL_COMMUNITY)

## 2024-09-18 ENCOUNTER — Encounter (HOSPITAL_COMMUNITY)

## 2024-09-20 ENCOUNTER — Encounter (HOSPITAL_COMMUNITY)

## 2024-09-21 ENCOUNTER — Ambulatory Visit (HOSPITAL_COMMUNITY): Payer: Self-pay | Admitting: Psychiatry

## 2024-09-22 ENCOUNTER — Encounter (HOSPITAL_COMMUNITY)

## 2024-09-25 ENCOUNTER — Encounter (HOSPITAL_COMMUNITY)

## 2024-09-27 ENCOUNTER — Encounter (HOSPITAL_COMMUNITY)

## 2024-09-29 ENCOUNTER — Encounter (HOSPITAL_COMMUNITY)

## 2024-10-02 ENCOUNTER — Encounter (HOSPITAL_COMMUNITY)

## 2024-10-04 ENCOUNTER — Encounter (HOSPITAL_COMMUNITY)

## 2024-10-06 ENCOUNTER — Encounter (HOSPITAL_COMMUNITY)

## 2024-10-09 ENCOUNTER — Encounter (HOSPITAL_COMMUNITY)

## 2024-10-11 ENCOUNTER — Encounter (HOSPITAL_COMMUNITY)

## 2024-10-13 ENCOUNTER — Encounter (HOSPITAL_COMMUNITY)

## 2024-10-16 ENCOUNTER — Encounter (HOSPITAL_COMMUNITY)

## 2024-10-18 ENCOUNTER — Encounter (HOSPITAL_COMMUNITY)

## 2024-10-25 ENCOUNTER — Encounter (HOSPITAL_COMMUNITY)

## 2024-11-01 ENCOUNTER — Encounter (HOSPITAL_COMMUNITY)

## 2024-11-08 ENCOUNTER — Encounter (HOSPITAL_COMMUNITY)

## 2024-11-15 ENCOUNTER — Encounter (HOSPITAL_COMMUNITY)

## 2024-11-22 ENCOUNTER — Encounter (HOSPITAL_COMMUNITY)

## 2024-11-29 ENCOUNTER — Encounter (HOSPITAL_COMMUNITY)

## 2024-12-06 ENCOUNTER — Encounter (HOSPITAL_COMMUNITY)

## 2024-12-13 ENCOUNTER — Encounter (HOSPITAL_COMMUNITY)

## 2024-12-20 ENCOUNTER — Encounter (HOSPITAL_COMMUNITY)

## 2024-12-27 ENCOUNTER — Encounter (HOSPITAL_COMMUNITY)

## 2025-01-03 ENCOUNTER — Encounter (HOSPITAL_COMMUNITY)

## 2025-01-10 ENCOUNTER — Encounter (HOSPITAL_COMMUNITY)

## 2025-01-17 ENCOUNTER — Encounter (HOSPITAL_COMMUNITY)

## 2025-01-24 ENCOUNTER — Encounter (HOSPITAL_COMMUNITY)

## 2025-01-31 ENCOUNTER — Encounter (HOSPITAL_COMMUNITY)

## 2025-02-07 ENCOUNTER — Encounter (HOSPITAL_COMMUNITY)

## 2025-02-14 ENCOUNTER — Encounter (HOSPITAL_COMMUNITY)

## 2025-02-21 ENCOUNTER — Encounter (HOSPITAL_COMMUNITY)

## 2025-02-28 ENCOUNTER — Encounter (HOSPITAL_COMMUNITY)

## 2025-03-07 ENCOUNTER — Encounter (HOSPITAL_COMMUNITY)

## 2025-03-14 ENCOUNTER — Encounter (HOSPITAL_COMMUNITY)
